# Patient Record
Sex: Male | Born: 1955 | Race: White | Hispanic: No | Marital: Married | Smoking: Never smoker
Health system: Southern US, Community
[De-identification: ages and names within clinical notes are randomized; demographics above are authoritative.]

## PROBLEM LIST (undated history)

## (undated) DIAGNOSIS — I712 Thoracic aortic aneurysm, without rupture, unspecified: Secondary | ICD-10-CM

## (undated) DIAGNOSIS — R519 Headache, unspecified: Secondary | ICD-10-CM

## (undated) DIAGNOSIS — R51 Headache: Secondary | ICD-10-CM

## (undated) DIAGNOSIS — M1990 Unspecified osteoarthritis, unspecified site: Secondary | ICD-10-CM

## (undated) DIAGNOSIS — I10 Essential (primary) hypertension: Secondary | ICD-10-CM

## (undated) DIAGNOSIS — K3580 Unspecified acute appendicitis: Secondary | ICD-10-CM

## (undated) DIAGNOSIS — K760 Fatty (change of) liver, not elsewhere classified: Secondary | ICD-10-CM

## (undated) DIAGNOSIS — J449 Chronic obstructive pulmonary disease, unspecified: Secondary | ICD-10-CM

## (undated) DIAGNOSIS — I4510 Unspecified right bundle-branch block: Secondary | ICD-10-CM

## (undated) DIAGNOSIS — I219 Acute myocardial infarction, unspecified: Secondary | ICD-10-CM

## (undated) DIAGNOSIS — M359 Systemic involvement of connective tissue, unspecified: Secondary | ICD-10-CM

## (undated) DIAGNOSIS — R739 Hyperglycemia, unspecified: Secondary | ICD-10-CM

## (undated) DIAGNOSIS — E668 Other obesity: Secondary | ICD-10-CM

## (undated) DIAGNOSIS — K3532 Acute appendicitis with perforation and localized peritonitis, without abscess: Secondary | ICD-10-CM

## (undated) DIAGNOSIS — G4730 Sleep apnea, unspecified: Secondary | ICD-10-CM

## (undated) DIAGNOSIS — I700 Atherosclerosis of aorta: Secondary | ICD-10-CM

## (undated) DIAGNOSIS — E669 Obesity, unspecified: Secondary | ICD-10-CM

## (undated) DIAGNOSIS — I77810 Thoracic aortic ectasia: Secondary | ICD-10-CM

## (undated) DIAGNOSIS — I499 Cardiac arrhythmia, unspecified: Secondary | ICD-10-CM

## (undated) HISTORY — DX: Thoracic aortic aneurysm, without rupture: I71.2

## (undated) HISTORY — DX: Unspecified acute appendicitis: K35.80

## (undated) HISTORY — DX: Fatty (change of) liver, not elsewhere classified: K76.0

## (undated) HISTORY — DX: Acute appendicitis with perforation and localized peritonitis, without abscess: K35.32

## (undated) HISTORY — DX: Hyperglycemia, unspecified: R73.9

## (undated) HISTORY — DX: Atherosclerosis of aorta: I70.0

## (undated) HISTORY — DX: Other obesity: E66.8

## (undated) HISTORY — DX: Obesity, unspecified: E66.9

## (undated) HISTORY — DX: Thoracic aortic ectasia: I77.810

## (undated) HISTORY — DX: Unspecified right bundle-branch block: I45.10

## (undated) HISTORY — DX: Essential (primary) hypertension: I10

## (undated) SURGERY — APPENDECTOMY, LAPAROSCOPIC
Anesthesia: General

---

## 1993-06-09 HISTORY — PX: BACK SURGERY: SHX140

## 1997-11-10 ENCOUNTER — Ambulatory Visit (HOSPITAL_COMMUNITY): Admission: RE | Admit: 1997-11-10 | Discharge: 1997-11-11 | Payer: Self-pay | Admitting: Neurosurgery

## 2014-06-09 DIAGNOSIS — I219 Acute myocardial infarction, unspecified: Secondary | ICD-10-CM

## 2014-06-09 HISTORY — DX: Acute myocardial infarction, unspecified: I21.9

## 2016-05-09 DIAGNOSIS — I712 Thoracic aortic aneurysm, without rupture, unspecified: Secondary | ICD-10-CM

## 2016-05-09 DIAGNOSIS — I4510 Unspecified right bundle-branch block: Secondary | ICD-10-CM

## 2016-05-09 HISTORY — DX: Unspecified right bundle-branch block: I45.10

## 2016-05-09 HISTORY — DX: Thoracic aortic aneurysm, without rupture, unspecified: I71.20

## 2016-05-09 HISTORY — PX: CARDIAC CATHETERIZATION: SHX172

## 2016-05-09 HISTORY — DX: Thoracic aortic aneurysm, without rupture: I71.2

## 2016-12-12 DIAGNOSIS — Z01818 Encounter for other preprocedural examination: Secondary | ICD-10-CM | POA: Diagnosis not present

## 2016-12-12 DIAGNOSIS — M75100 Unspecified rotator cuff tear or rupture of unspecified shoulder, not specified as traumatic: Secondary | ICD-10-CM | POA: Diagnosis not present

## 2016-12-12 DIAGNOSIS — Z6837 Body mass index (BMI) 37.0-37.9, adult: Secondary | ICD-10-CM | POA: Diagnosis not present

## 2016-12-12 DIAGNOSIS — Z1389 Encounter for screening for other disorder: Secondary | ICD-10-CM | POA: Diagnosis not present

## 2017-01-13 ENCOUNTER — Telehealth: Payer: Self-pay | Admitting: Cardiology

## 2017-01-13 NOTE — Telephone Encounter (Signed)
01/13/17-Received incoming records from [facility] for upcoming appointment on 02/02/17 @ 8:40am with Dr. Ellyn Hack. Records given to [facility] in Medical Records. ab

## 2017-01-20 ENCOUNTER — Encounter: Payer: Self-pay | Admitting: *Deleted

## 2017-02-02 ENCOUNTER — Encounter: Payer: Self-pay | Admitting: Cardiology

## 2017-02-02 ENCOUNTER — Ambulatory Visit (INDEPENDENT_AMBULATORY_CARE_PROVIDER_SITE_OTHER): Payer: BLUE CROSS/BLUE SHIELD | Admitting: Cardiology

## 2017-02-02 VITALS — BP 109/78 | HR 68 | Ht 69.0 in | Wt 252.0 lb

## 2017-02-02 DIAGNOSIS — R0609 Other forms of dyspnea: Secondary | ICD-10-CM | POA: Diagnosis not present

## 2017-02-02 DIAGNOSIS — R0600 Dyspnea, unspecified: Secondary | ICD-10-CM

## 2017-02-02 DIAGNOSIS — I712 Thoracic aortic aneurysm, without rupture, unspecified: Secondary | ICD-10-CM

## 2017-02-02 DIAGNOSIS — Z01818 Encounter for other preprocedural examination: Secondary | ICD-10-CM | POA: Diagnosis not present

## 2017-02-02 DIAGNOSIS — I10 Essential (primary) hypertension: Secondary | ICD-10-CM | POA: Diagnosis not present

## 2017-02-02 DIAGNOSIS — Z01810 Encounter for preprocedural cardiovascular examination: Secondary | ICD-10-CM
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
## 2017-02-02 NOTE — Patient Instructions (Signed)
NO CHANGE IN MEDICATIONS  SCHEDULE AT [place] [facility] Non-Cardiac CT scanning, (CAT scanning), is a noninvasive, special x-ray that produces cross-sectional images of the body using x-rays and a computer- thoracic aortic aneurysm. CT scans help physicians diagnose and treat medical conditions. For some CT exams, a contrast material is used to enhance visibility in the area of the body being studied. CT scans provide greater clarity and reveal more details than regular x-ray exams.   SCHEDULE AT [facility] has requested that you have an echocardiogram. Echocardiography is a painless test that uses sound waves to create images of your heart. It provides your doctor with information about the size and shape of your heart and how well your heart's chambers and valves are working. This procedure takes approximately one hour. There are no restrictions for this procedure.   MAY NEED LABS - BMP FOR CT SCAN     IF ECHO IS NORMAL YOU WILL BE CLEARED FOR SURGERY.    Your physician recommends that you schedule a follow-up appointment in Dec 2018 with DR HARDING.

## 2017-02-04 ENCOUNTER — Encounter: Payer: Self-pay | Admitting: Cardiology

## 2017-02-04 DIAGNOSIS — I10 Essential (primary) hypertension: Secondary | ICD-10-CM | POA: Insufficient documentation

## 2017-02-04 DIAGNOSIS — Z01810 Encounter for preprocedural cardiovascular examination: Secondary | ICD-10-CM | POA: Insufficient documentation

## 2017-02-04 DIAGNOSIS — I712 Thoracic aortic aneurysm, without rupture, unspecified: Secondary | ICD-10-CM | POA: Insufficient documentation

## 2017-02-04 DIAGNOSIS — R0609 Other forms of dyspnea: Secondary | ICD-10-CM | POA: Insufficient documentation

## 2017-02-04 DIAGNOSIS — R0600 Dyspnea, unspecified: Secondary | ICD-10-CM | POA: Insufficient documentation

## 2017-02-04 HISTORY — DX: Essential (primary) hypertension: I10

## 2017-02-04 NOTE — Assessment & Plan Note (Signed)
Richard Gallagher is planning to have right shoulder surgery done which is a low risk surgery from a cardiac standpoint.  he just had a heart catheterization which showed normal coronary arteries and preserve I am checking a echocardiogram because of his dilated aortic root and systolic murmur. I suspect he has some aortic sclerosis, but there is no comment on stenosis by cath. He does not have any heart failure symptoms of PND, orthopnea or edema (however neck of cardiomyopathy understand his diastolic filling pressures. He does not have diabetes documented, has normal renal function, and  Does not history of stroke.  PREOPERATIVE CARDIAC RISK ASSESSMENT   Revised Cardiac Risk Index:  High Risk Surgery: no; Low  Defined as Intraperitoneal, intrathoracic or suprainguinal vascular  Active CAD: no; Normal Cath  CHF: no; Normal EF (Checking Echo to assess Diastolic function & filling pressures)  Cerebrovascular Disease: no;  Diabetes: no; On Insulin: no  CKD (Cr >~ 2): no;  0.86  Total: 0 Estimated Risk of Adverse Outcome: LOW RISK for LOW RISK SURGERY  Estimated Risk of MI, PE, VF/VT (Cardiac Arrest), Complete Heart Block: <1 % - further reduced by long-term BB dose.  No further Cardiac Evaluation is needed for ischemic evaluation given recent Cardiac Catheterizations. Checking 2 D Echo more to assess murmur & Aortic Root diameter.  Recommend: Proceed to OR without further evaluation.  LOW RISK. - monitor for OSA post-op.

## 2017-02-04 NOTE — Assessment & Plan Note (Signed)
This is a long-standing issue for him.  he does not appear to have any active heart failure symptoms with no PND or orthopnea. Technique 2-D echocardiogram to evaluate his murmur andaortic root, however this will help [place] know his diastolic filling pressures.   He is on ARB and atenolol.

## 2017-02-04 NOTE — Assessment & Plan Note (Addendum)
Noted on thoracic aortic angiography  Done in [place] will order a CT angiogram of the chest to establish a baseline evaluation here in [place]. We will also check a 2-D echocardiogram to evaluate for aortic valve disease given his systolic murmur -  The murmur sounds more consistent with aortic sclerosis and not stenosis however.   Otherwise plan will be to continue risk factor modification with blood pressure control and lipid control. His last set of lipids showed LDL of 85 which ispretty much at [facility] for him. He is lowDL 32 for which I recommended exercise.

## 2017-02-04 NOTE — Assessment & Plan Note (Signed)
Well controlled  On current dose of beta blocker and ARB.

## 2017-02-11 ENCOUNTER — Other Ambulatory Visit: Payer: Self-pay

## 2017-02-11 ENCOUNTER — Ambulatory Visit (HOSPITAL_COMMUNITY): Payer: BLUE CROSS/BLUE SHIELD | Attending: Cardiology

## 2017-02-11 DIAGNOSIS — R0600 Dyspnea, unspecified: Secondary | ICD-10-CM

## 2017-02-11 DIAGNOSIS — Z01810 Encounter for preprocedural cardiovascular examination: Secondary | ICD-10-CM | POA: Diagnosis not present

## 2017-02-11 DIAGNOSIS — I712 Thoracic aortic aneurysm, without rupture, unspecified: Secondary | ICD-10-CM

## 2017-02-11 DIAGNOSIS — R0609 Other forms of dyspnea: Secondary | ICD-10-CM | POA: Insufficient documentation

## 2017-03-06 DIAGNOSIS — J208 Acute bronchitis due to other specified organisms: Secondary | ICD-10-CM | POA: Diagnosis not present

## 2017-03-06 DIAGNOSIS — Z6838 Body mass index (BMI) 38.0-38.9, adult: Secondary | ICD-10-CM | POA: Diagnosis not present

## 2017-03-16 DIAGNOSIS — Z6838 Body mass index (BMI) 38.0-38.9, adult: Secondary | ICD-10-CM | POA: Diagnosis not present

## 2017-03-16 DIAGNOSIS — R05 Cough: Secondary | ICD-10-CM | POA: Diagnosis not present

## 2017-03-16 DIAGNOSIS — E669 Obesity, unspecified: Secondary | ICD-10-CM | POA: Diagnosis not present

## 2017-04-09 HISTORY — PX: ROTATOR CUFF REPAIR: SHX139

## 2017-04-20 ENCOUNTER — Inpatient Hospital Stay: Admission: RE | Admit: 2017-04-20 | Payer: BLUE CROSS/BLUE SHIELD | Source: Ambulatory Visit

## 2017-04-23 DIAGNOSIS — G8918 Other acute postprocedural pain: Secondary | ICD-10-CM | POA: Diagnosis not present

## 2017-04-23 DIAGNOSIS — M7542 Impingement syndrome of left shoulder: Secondary | ICD-10-CM | POA: Diagnosis not present

## 2017-04-23 DIAGNOSIS — M75112 Incomplete rotator cuff tear or rupture of left shoulder, not specified as traumatic: Secondary | ICD-10-CM | POA: Diagnosis not present

## 2017-04-23 DIAGNOSIS — M7552 Bursitis of left shoulder: Secondary | ICD-10-CM | POA: Diagnosis not present

## 2017-04-23 DIAGNOSIS — M958 Other specified acquired deformities of musculoskeletal system: Secondary | ICD-10-CM | POA: Diagnosis not present

## 2017-04-23 DIAGNOSIS — M75122 Complete rotator cuff tear or rupture of left shoulder, not specified as traumatic: Secondary | ICD-10-CM | POA: Diagnosis not present

## 2017-04-23 DIAGNOSIS — M24112 Other articular cartilage disorders, left shoulder: Secondary | ICD-10-CM | POA: Diagnosis not present

## 2017-05-08 DIAGNOSIS — M24112 Other articular cartilage disorders, left shoulder: Secondary | ICD-10-CM | POA: Diagnosis not present

## 2017-05-08 DIAGNOSIS — M25532 Pain in left wrist: Secondary | ICD-10-CM | POA: Diagnosis not present

## 2017-05-08 DIAGNOSIS — M75122 Complete rotator cuff tear or rupture of left shoulder, not specified as traumatic: Secondary | ICD-10-CM | POA: Diagnosis not present

## 2017-05-25 ENCOUNTER — Ambulatory Visit: Payer: BLUE CROSS/BLUE SHIELD | Admitting: Cardiology

## 2017-06-05 DIAGNOSIS — M7552 Bursitis of left shoulder: Secondary | ICD-10-CM | POA: Diagnosis not present

## 2017-06-05 DIAGNOSIS — M75122 Complete rotator cuff tear or rupture of left shoulder, not specified as traumatic: Secondary | ICD-10-CM | POA: Diagnosis not present

## 2017-06-18 DIAGNOSIS — Z23 Encounter for immunization: Secondary | ICD-10-CM | POA: Diagnosis not present

## 2017-06-19 DIAGNOSIS — M25612 Stiffness of left shoulder, not elsewhere classified: Secondary | ICD-10-CM | POA: Diagnosis not present

## 2017-06-19 DIAGNOSIS — M6281 Muscle weakness (generalized): Secondary | ICD-10-CM | POA: Diagnosis not present

## 2017-06-19 DIAGNOSIS — M25512 Pain in left shoulder: Secondary | ICD-10-CM | POA: Diagnosis not present

## 2017-06-20 ENCOUNTER — Encounter: Payer: Self-pay | Admitting: Emergency Medicine

## 2017-06-20 ENCOUNTER — Emergency Department: Payer: BLUE CROSS/BLUE SHIELD

## 2017-06-20 ENCOUNTER — Inpatient Hospital Stay
Admission: EM | Admit: 2017-06-20 | Discharge: 2017-06-25 | DRG: 373 | Disposition: A | Discharge status: Home / Self Care | Payer: BLUE CROSS/BLUE SHIELD | Attending: General Surgery | Admitting: General Surgery

## 2017-06-20 ENCOUNTER — Other Ambulatory Visit: Payer: Self-pay

## 2017-06-20 DIAGNOSIS — I10 Essential (primary) hypertension: Secondary | ICD-10-CM | POA: Diagnosis present

## 2017-06-20 DIAGNOSIS — Z6837 Body mass index (BMI) 37.0-37.9, adult: Secondary | ICD-10-CM

## 2017-06-20 DIAGNOSIS — E668 Other obesity: Secondary | ICD-10-CM | POA: Diagnosis present

## 2017-06-20 DIAGNOSIS — K3532 Acute appendicitis with perforation and localized peritonitis, without abscess: Secondary | ICD-10-CM

## 2017-06-20 DIAGNOSIS — R51 Headache: Secondary | ICD-10-CM | POA: Diagnosis not present

## 2017-06-20 DIAGNOSIS — K3580 Unspecified acute appendicitis: Secondary | ICD-10-CM

## 2017-06-20 DIAGNOSIS — Z79899 Other long term (current) drug therapy: Secondary | ICD-10-CM

## 2017-06-20 DIAGNOSIS — I712 Thoracic aortic aneurysm, without rupture: Secondary | ICD-10-CM | POA: Diagnosis not present

## 2017-06-20 DIAGNOSIS — R109 Unspecified abdominal pain: Secondary | ICD-10-CM | POA: Diagnosis not present

## 2017-06-20 HISTORY — DX: Acute appendicitis with perforation, localized peritonitis, and gangrene, without abscess: K35.32

## 2017-06-20 LAB — CBC
HCT: 45.8 % (ref 40.0–52.0)
HEMATOCRIT: 47.5 % (ref 40.0–52.0)
HEMOGLOBIN: 16 g/dL (ref 13.0–18.0)
HEMOGLOBIN: 15.4 g/dL (ref 13.0–18.0)
MCH: 28 pg (ref 26.0–34.0)
MCH: 28.3 pg (ref 26.0–34.0)
MCHC: 33.8 g/dL (ref 32.0–36.0)
MCHC: 33.7 g/dL (ref 32.0–36.0)
MCV: 82.9 fL (ref 80.0–100.0)
MCV: 83.8 fL (ref 80.0–100.0)
Platelets: 302 10*3/uL (ref 150–440)
Platelets: 238 10*3/uL (ref 150–440)
RBC: 5.72 MIL/uL (ref 4.40–5.90)
RBC: 5.46 MIL/uL (ref 4.40–5.90)
RDW: 13.7 % (ref 11.5–14.5)
RDW: 13.9 % (ref 11.5–14.5)
WBC: 22 10*3/uL — ABNORMAL HIGH (ref 3.8–10.6)
WBC: 21.5 10*3/uL — AB (ref 3.8–10.6)

## 2017-06-20 LAB — URINALYSIS, COMPLETE (UACMP) WITH MICROSCOPIC
BILIRUBIN URINE: NEGATIVE
Glucose, UA: NEGATIVE mg/dL
Hgb urine dipstick: NEGATIVE
Ketones, ur: NEGATIVE mg/dL
Leukocytes, UA: NEGATIVE
NITRITE: NEGATIVE
PH: 6 (ref 5.0–8.0)
Protein, ur: NEGATIVE mg/dL
SPECIFIC GRAVITY, URINE: 1.01 (ref 1.005–1.030)
Squamous Epithelial / LPF: NONE SEEN

## 2017-06-20 LAB — CREATININE, SERUM
Creatinine, Ser: 0.78 mg/dL (ref 0.61–1.24)
GFR calc non Af Amer: >60 mL/min (ref >60)

## 2017-06-20 LAB — LIPASE, BLOOD: Lipase: 28 U/L (ref 11–51)

## 2017-06-20 LAB — COMPREHENSIVE METABOLIC PANEL
ALBUMIN: 3.8 g/dL (ref 3.5–5.0)
ALK PHOS: 68 U/L (ref 38–126)
ALT: 28 U/L (ref 17–63)
AST: 30 U/L (ref 15–41)
Anion gap: 14 (ref 5–15)
BILIRUBIN TOTAL: 1.1 mg/dL (ref 0.3–1.2)
BUN: 14 mg/dL (ref 6–20)
CALCIUM: 9.4 mg/dL (ref 8.9–10.3)
CO2: 25 mmol/L (ref 22–32)
Chloride: 97 mmol/L — ABNORMAL LOW (ref 101–111)
Creatinine, Ser: 0.82 mg/dL (ref 0.61–1.24)
GFR calc Af Amer: >60 mL/min (ref >60)
GFR calc non Af Amer: >60 mL/min (ref >60)
GLUCOSE: 151 mg/dL — AB (ref 65–99)
POTASSIUM: 3.5 mmol/L (ref 3.5–5.1)
SODIUM: 136 mmol/L (ref 135–145)
TOTAL PROTEIN: 7.3 g/dL (ref 6.5–8.1)

## 2017-06-20 MED ORDER — PIPERACILLIN-TAZOBACTAM 3.375 G IVPB
3.3750 g | Freq: Three times a day (TID) | INTRAVENOUS | Status: DC
  Administered 2017-06-20 – 2017-06-25 (×14): 3.375 g via INTRAVENOUS
  Filled 2017-06-20 (×14): qty 50

## 2017-06-20 MED ORDER — ONDANSETRON 4 MG PO TBDP: 4.0000 mg | ORAL_TABLET | Freq: Four times a day (QID) | ORAL | Status: DC | PRN

## 2017-06-20 MED ORDER — IOPAMIDOL (ISOVUE-370) INJECTION 76%
100.0000 mL | Freq: Once | INTRAVENOUS | Status: AC | PRN
  Administered 2017-06-20: 100 mL via INTRAVENOUS

## 2017-06-20 MED ORDER — HYDRALAZINE HCL 20 MG/ML IJ SOLN: 10.0000 mg | INTRAMUSCULAR | Status: DC | PRN

## 2017-06-20 MED ORDER — PANTOPRAZOLE SODIUM 40 MG IV SOLR
40.0000 mg | Freq: Every day | INTRAVENOUS | Status: DC
  Administered 2017-06-20 – 2017-06-24 (×5): 40 mg via INTRAVENOUS
  Filled 2017-06-20 (×5): qty 40

## 2017-06-20 MED ORDER — ENOXAPARIN SODIUM 40 MG/0.4ML ~~LOC~~ SOLN
40.0000 mg | SUBCUTANEOUS | Status: DC
  Administered 2017-06-20 – 2017-06-24 (×5): 40 mg via SUBCUTANEOUS
  Filled 2017-06-20 (×5): qty 0.4

## 2017-06-20 MED ORDER — ONDANSETRON HCL 4 MG/2ML IJ SOLN: 4.0000 mg | Freq: Four times a day (QID) | INTRAMUSCULAR | Status: DC | PRN

## 2017-06-20 MED ORDER — ONDANSETRON 4 MG PO TBDP
ORAL_TABLET | ORAL | Status: AC
  Filled 2017-06-20: qty 1

## 2017-06-20 MED ORDER — LACTATED RINGERS IV SOLN
INTRAVENOUS | Status: DC
  Administered 2017-06-20 – 2017-06-22 (×5): via INTRAVENOUS

## 2017-06-20 MED ORDER — ONDANSETRON 4 MG PO TBDP
4.0000 mg | ORAL_TABLET | Freq: Once | ORAL | Status: AC | PRN
  Administered 2017-06-20: 4 mg via ORAL

## 2017-06-20 MED ORDER — ONDANSETRON HCL 4 MG/2ML IJ SOLN
4.0000 mg | Freq: Once | INTRAMUSCULAR | Status: AC
  Administered 2017-06-20: 4 mg via INTRAVENOUS
  Filled 2017-06-20: qty 2

## 2017-06-20 MED ORDER — ACETAMINOPHEN 325 MG PO TABS
650.0000 mg | ORAL_TABLET | Freq: Four times a day (QID) | ORAL | Status: DC | PRN
  Administered 2017-06-20: 650 mg via ORAL
  Filled 2017-06-20: qty 2

## 2017-06-20 MED ORDER — IOPAMIDOL (ISOVUE-300) INJECTION 61%
30.0000 mL | Freq: Once | INTRAVENOUS | Status: AC | PRN
  Administered 2017-06-20: 30 mL via ORAL

## 2017-06-20 MED ORDER — DIPHENHYDRAMINE HCL 12.5 MG/5ML PO ELIX
12.5000 mg | ORAL_SOLUTION | Freq: Four times a day (QID) | ORAL | Status: DC | PRN
  Filled 2017-06-20: qty 5

## 2017-06-20 MED ORDER — PIPERACILLIN-TAZOBACTAM 3.375 G IVPB 30 MIN
3.3750 g | Freq: Once | INTRAVENOUS | Status: AC
  Administered 2017-06-20: 3.375 g via INTRAVENOUS
  Filled 2017-06-20: qty 50

## 2017-06-20 MED ORDER — SODIUM CHLORIDE 0.9 % IV BOLUS (SEPSIS)
1000.0000 mL | Freq: Once | INTRAVENOUS | Status: AC
  Administered 2017-06-20: 1000 mL via INTRAVENOUS

## 2017-06-20 MED ORDER — MORPHINE SULFATE (PF) 4 MG/ML IV SOLN
4.0000 mg | INTRAVENOUS | Status: DC | PRN
  Administered 2017-06-20 – 2017-06-21 (×2): 4 mg via INTRAVENOUS
  Filled 2017-06-20 (×2): qty 1

## 2017-06-20 MED ORDER — DIPHENHYDRAMINE HCL 50 MG/ML IJ SOLN: 12.5000 mg | Freq: Four times a day (QID) | INTRAMUSCULAR | Status: DC | PRN

## 2017-06-20 NOTE — ED Notes (Signed)
Pt in CT.

## 2017-06-20 NOTE — H&P (Signed)
Patient ID: Richard Gallagher, male   DOB: February 21, 1956, 62 y.o.   MRN: [id number]  CC: Abdominal pain  HPI Richard Gallagher is a 62 y.o. male presents the emergency department with a now 3-day history of abdominal pain.  Patient reports that the pain was actually at its maximal intensity last night and then gradually relieved.  He still has tenderness to the right lower quadrant but not near as bad as last night.  He has had subjective chills but denies any fevers, nausea, vomiting, chest pain, shortness of breath, diarrhea, constipation.  He has had a lack of appetite secondary to the pain.  He came to the hospital today because his family made him due to the prolonged symptoms of pain.  He is otherwise in his usual state of health and denies any recent sick contacts or travels.  HPI  Past Medical History:  Diagnosis Date  . Hypertension   . Moderate obesity    Had been better controlled while he was living in [place]. He gained 30 pounds since returning in January 2018  . Right bundle branch block (RBBB) on electrocardiogram (ECG) 05/2016  . Thoracic aortic aneurysm without rupture ([place]) 05/2016   Noted on Cardiac Cath with TAA Angio - Aortic Root 5.1 cm    Past Surgical History:  Procedure Laterality Date  . BACK SURGERY    . CARDIAC CATHETERIZATION  05/2016    ([place], [place]. Dr. Modesto Charon) 05/22/16: EF 55%. Normal coronary arteries. Dilated aortic root at 5.1 cm. 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 codominant  . ROTATOR CUFF REPAIR Left     Family History  Problem Relation Age of Onset  . Hypertension Mother   . Diabetes Mother   . Heart disease Mother        He is not sure of details  . Arthritis Mother   . Hypertension Father   . Diabetes Father   . Arthritis Father   . Stroke Brother     Social History Social History   Tobacco Use  . Smoking status: Never Smoker  . Smokeless tobacco: Never Used  Substance Use Topics  .  Alcohol use: Yes    Comment:  moderate use  . Drug use: No    No Known Allergies  Current Facility-Administered Medications  Medication Dose Route Frequency Provider Last Rate Last Dose  . piperacillin-tazobactam (ZOSYN) IVPB 3.375 g  3.375 g Intravenous Once Lisa Roca, MD 100 mL/hr at 06/20/17 1525 3.375 g at 06/20/17 1525   Current Outpatient Medications  Medication Sig Dispense Refill  . atenolol (TENORMIN) 50 MG tablet Take 50 mg by mouth daily.  0  . hydrochlorothiazide (HYDRODIURIL) 25 MG tablet Take 25 mg by mouth daily.  0  . losartan (COZAAR) 25 MG tablet Take 25 mg by mouth daily.  0  . MULTIPLE VITAMIN PO Take 1 tablet by mouth daily.    . Multiple Vitamins-Minerals (ZINC PO) Take 1 tablet by mouth daily.    . Omega-3 Fatty Acids (FISH OIL) 1000 MG CAPS Take 1,000 mg by mouth daily.    Marland Kitchen zolpidem (AMBIEN) 5 MG tablet Take 5 mg by mouth at bedtime as needed.  1     Review of Systems A thigh point review of systems was asked and was negative except for the findings documented in the HPI  Physical Exam Blood pressure 110/61, pulse 87, temperature 99.5 F (37.5 C), resp. rate 16, height 5' 9"  (1.753  m), weight 113.4 kg (250 lb), SpO2 96 %. CONSTITUTIONAL: No acute distress. EYES: Pupils are equal, round, and reactive to light, Sclera are non-icteric. EARS, NOSE, MOUTH AND THROAT: The oropharynx is clear. The oral mucosa is pink and moist. Hearing is intact to voice. LYMPH NODES:  Lymph nodes in the neck are normal. RESPIRATORY:  Lungs are clear. There is normal respiratory effort, with equal breath sounds bilaterally, and without pathologic use of accessory muscles. CARDIOVASCULAR: Heart is regular without murmurs, gallops, or rubs. GI: The abdomen is large, soft, mildly tender to palpation in the right lower quadrant but with a negative Rovsing or rebound, and nondistended. There are no palpable masses. There is no hepatosplenomegaly. There are normal bowel sounds in  all quadrants. GU: Rectal deferred.   MUSCULOSKELETAL: Normal muscle strength and tone. No cyanosis or edema.   SKIN: Turgor is good and there are no pathologic skin lesions or ulcers. NEUROLOGIC: Motor and sensation is grossly normal. Cranial nerves are grossly intact. PSYCH:  Oriented to person, place and time. Affect is normal.  Data Reviewed Images and labs reviewed which show a leukocytosis of 22 but are otherwise within normal limits.  CT scan of the abdomen shows a dilated thickened appendix with evidence of mid body rupture.  There is inflammation involving the entire length of the appendix, the cecum, the distal small bowel. I have personally reviewed the patient's imaging, laboratory findings and medical records.    Assessment    Ruptured appendicitis    Plan    62 year old male with a ruptured appendicitis with inflammation involving the cecum.  Discussed with the patient and the family that given his state of being ruptured it is actually higher risk to go to surgery then to be treated with antibiotics.  Discussed the plan would be for admission for IV hydration and IV antibiotics.  Counseled that should he worsen in spite of IV antibiotics then we would proceed with surgery and accept the increased risk of having to remove more than just his appendix to get out the infection.  They voiced understanding.  Plan for admission, IV fluid resuscitation, IV antibiotics.     Time spent with the patient was 50 minutes, with more than 50% of the time spent in face-to-face education, counseling and care coordination.     Clayburn Pert, MD FACS General Surgeon 06/20/2017, 3:43 PM

## 2017-06-20 NOTE — ED Provider Notes (Signed)
[facility] Emergency Department Provider Note ____________________________________________   I have reviewed the triage vital signs and the triage nursing note.  HISTORY  Chief Complaint Abdominal Pain   Historian Patient, family members at the bedside  HPI Richard Gallagher is a 62 y.o. male presenting for 24 hours of abdominal pain also in the right lower quadrant.  Overnight was severe about 10 out of 10 with belly/abdominal swelling.  Pain did seem to decrease this morning and currently is about 4 5 out of 10 as long as he is not moving.  Any movement will make it significantly worse.  This morning states he had some burning with urination.  Positive for nausea without vomiting or diarrhea.  States he had "appendicitis" when he was a child however his symptoms went away so he did not have surgery.  He states he was thinking he could have possible appendicitis based on the way that it feels.     Past Medical History:  Diagnosis Date  . Hypertension   . Moderate obesity    Had been better controlled while he was living in [place]. He gained 30 pounds since returning in January 2018  . Right bundle branch block (RBBB) on electrocardiogram (ECG) 05/2016  . Thoracic aortic aneurysm without rupture ([place]) 05/2016   Noted on Cardiac Cath with TAA Angio - Aortic Root 5.1 cm    Patient Active Problem List   Diagnosis Date Noted  . Pre-operative cardiovascular examination 02/04/2017  . DOE (dyspnea on exertion) 02/04/2017  . Thoracic aortic aneurysm without rupture ([place]) 02/04/2017  . Essential hypertension 02/04/2017    Past Surgical History:  Procedure Laterality Date  . BACK SURGERY    . CARDIAC CATHETERIZATION  05/2016    ([place], [place]. Dr. Modesto Charon) 05/22/16: EF 55%. Normal coronary arteries. Dilated aortic root at 5.1 cm. 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 codominant  . ROTATOR CUFF REPAIR Left      Prior to Admission medications   Medication Sig Start Date End Date Taking? Authorizing Provider  atenolol (TENORMIN) 50 MG tablet Take 50 mg by mouth daily. 01/15/17   [provider]  hydrochlorothiazide (HYDRODIURIL) 25 MG tablet Take 25 mg by mouth daily. 01/15/17   [provider]  losartan (COZAAR) 25 MG tablet Take 25 mg by mouth daily. 01/15/17   [provider]  MULTIPLE VITAMIN PO Take 1 tablet by mouth daily.    [provider]  Multiple Vitamins-Minerals (ZINC PO) Take 1 tablet by mouth daily.    [provider]  Omega-3 Fatty Acids (FISH OIL) 1000 MG CAPS Take 1,000 mg by mouth daily.    [provider]  zolpidem (AMBIEN) 5 MG tablet Take 5 mg by mouth at bedtime as needed. 01/15/17   [provider]    No Known Allergies  Family History  Problem Relation Age of Onset  . Hypertension Mother   . Diabetes Mother   . Heart disease Mother        He is not sure of details  . Arthritis Mother   . Hypertension Father   . Diabetes Father   . Arthritis Father   . Stroke Brother     Social History Social History   Tobacco Use  . Smoking status: Never Smoker  . Smokeless tobacco: Never Used  Substance Use Topics  . Alcohol use: Yes    Comment:  moderate use  . Drug use: No  Review of Systems  Constitutional: Negative for fever. Eyes: Negative for visual changes. ENT: Negative for sore throat. Cardiovascular: Negative for chest pain. Respiratory: Negative for shortness of breath. Gastrointestinal: Positive for abdominal pain especially in the right lower quadrant as per HPI Genitourinary: Negative for hematuria. Musculoskeletal: Negative for back pain. Skin: Negative for rash. Neurological: Negative for headache.  ____________________________________________   PHYSICAL EXAM:  VITAL SIGNS: ED Triage Vitals  Enc Vitals Group     BP 06/20/17 1055 138/83     Pulse Rate 06/20/17 1055 (!) 106      Resp 06/20/17 1055 20     Temp 06/20/17 1055 99.5 F (37.5 C)     Temp src --      SpO2 06/20/17 1055 93 %     Weight 06/20/17 1058 250 lb (113.4 kg)     Height 06/20/17 1058 5\' 9"  (1.753 m)     Head Circumference --      Peak Flow --      Pain Score 06/20/17 1057 6     Pain Loc --      Pain Edu? --      Excl. in [place]? --      Constitutional: Alert and oriented. Well appearing and in no distress. HEENT   Head: Normocephalic and atraumatic.      Eyes: Conjunctivae are normal. Pupils equal and round.       Ears:         Nose: No congestion/rhinnorhea.   Mouth/Throat: Mucous membranes are moist.   Neck: No stridor. Cardiovascular/Chest: Normal rate, regular rhythm.  No murmurs, rubs, or gallops. Respiratory: Normal respiratory effort without tachypnea nor retractions. Breath sounds are clear and equal bilaterally. No wheezes/rales/rhonchi. Gastrointestinal: Soft. No distention.  Moderate to severe tenderness with mild guarding in the right lower quadrant.  Mild tenderness diffusely. Genitourinary/rectal:Deferred Musculoskeletal: Nontender with normal range of motion in all extremities. No joint effusions.  No lower extremity tenderness.  No edema. Neurologic:  Normal speech and language. No gross or focal neurologic deficits are appreciated. Skin:  Skin is warm, dry and intact. No rash noted. Psychiatric: Mood and affect are normal. Speech and behavior are normal. Patient exhibits appropriate insight and judgment.   ____________________________________________  LABS (pertinent positives/negatives) I, Lisa Roca, MD the attending physician have reviewed the labs noted below.  Labs Reviewed  COMPREHENSIVE METABOLIC PANEL - Abnormal; Notable for the following components:      Result Value   Chloride 97 (*)    Glucose, Bld 151 (*)    All other components within normal limits  CBC - Abnormal; Notable for the following components:   WBC 22.0 (*)    All other components  within normal limits  URINALYSIS, COMPLETE (UACMP) WITH MICROSCOPIC - Abnormal; Notable for the following components:   Color, Urine STRAW (*)    APPearance CLEAR (*)    Bacteria, UA RARE (*)    All other components within normal limits  LIPASE, BLOOD    ____________________________________________    EKG I, Lisa Roca, MD, the attending physician have personally viewed and interpreted all ECGs.  None ____________________________________________  RADIOLOGY All Xrays were viewed by me.  Imaging interpreted by Radiologist, and I, Lisa Roca, MD the attending physician have reviewed the radiologist interpretation noted below.  CT abdomen pelvis with contrast:  IMPRESSION: Acute appendicitis with severe inflammation and probable focal rupture based on severe inflammation, periappendiceal fluid and single focus of extraluminal gas. No discrete appendiceal abscess identified. There is a  calcified appendicolith at the base of the appendix near the appendiceal orifice. __________________________________________  PROCEDURES  Procedure(s) performed: None  Critical Care performed: None   ____________________________________________  ED COURSE / ASSESSMENT AND PLAN  Pertinent labs & imaging results that were available during my care of the patient were reviewed by me and considered in my medical decision making (see chart for details).  High concern for significant intra-abdominal process with pain and some guarding in the right lower quadrant also associated with elevated white blood cell count.  Discussed CT scan risks and benefits and chose to proceed.  CT scan result shows acute appendicitis with possibility of focal rupture.  Patient given dose of IV Zosyn.  Consult with general surgeon, who will review the CT scan as well as the patient and discuss further management.  Patient and family updated.  DIFFERENTIAL DIAGNOSIS: Differential diagnosis includes, but is not  limited to, ovarian cyst, ovarian torsion, acute appendicitis, diverticulitis, urinary tract infection/pyelonephritis, endometriosis, bowel obstruction, colitis, renal colic, gastroenteritis, hernia, fibroids, endometriosis, pregnancy related pain including ectopic pregnancy, etc.  CONSULTATIONS:   General surgery, Dr. Adonis Huguenin, will evaluate patient.   Patient / Family / Caregiver informed of clinical course, medical decision-making process, and agree with plan.     ___________________________________________   FINAL CLINICAL IMPRESSION(S) / ED DIAGNOSES   Final diagnoses:  Acute appendicitis, unspecified acute appendicitis type      ___________________________________________        Note: This dictation was prepared with Dragon dictation. Any transcriptional errors that result from this process are unintentional    Lisa Roca, MD 06/20/17 1501

## 2017-06-20 NOTE — ED Notes (Signed)
2nd urine sample sent down by this RN.

## 2017-06-20 NOTE — ED Notes (Signed)
Pt states yesterday began with RLQ abd pain. States some nausea but denies vomiting or diarrhea. Still has all belly organs. Alert, oriented. Family at bedside. No distress noted at this time.

## 2017-06-20 NOTE — ED Triage Notes (Signed)
Patient to ER for c/o RLQ abd pain and right groin pain. Patient also reports history of fever this am of 100.7. Patient states pain began last night. +Nausea, but denies any vomiting or diarrhea. Patient states this is worst pain he has ever had last night. +Bloating. Patient had appendicitis as a child, but did not have appendectomy. Denies any h/o kidney stones.

## 2017-06-20 NOTE — ED Notes (Signed)
Pt returned from CT °

## 2017-06-20 NOTE — ED Notes (Signed)
Pt oxygen dropped to 86%. With deep breathing pt oxygen came up to 88%. Family states pt has COPD and possibly sleep apnea. States last time he was in hospital he had to wear oxygen. Placed on 1 L nasal cannula and came up to 92%. Will continue to monitor.

## 2017-06-21 LAB — BASIC METABOLIC PANEL
Anion gap: 9 (ref 5–15)
BUN: 10 mg/dL (ref 6–20)
CO2: 30 mmol/L (ref 22–32)
CREATININE: 0.82 mg/dL (ref 0.61–1.24)
Calcium: 8.4 mg/dL — ABNORMAL LOW (ref 8.9–10.3)
Chloride: 96 mmol/L — ABNORMAL LOW (ref 101–111)
GFR calc Af Amer: >60 mL/min (ref >60)
Glucose, Bld: 134 mg/dL — ABNORMAL HIGH (ref 65–99)
Potassium: 3.1 mmol/L — ABNORMAL LOW (ref 3.5–5.1)
SODIUM: 135 mmol/L (ref 135–145)

## 2017-06-21 LAB — CBC
HCT: 43.8 % (ref 40.0–52.0)
Hemoglobin: 14.5 g/dL (ref 13.0–18.0)
MCH: 28.2 pg (ref 26.0–34.0)
MCHC: 33.1 g/dL (ref 32.0–36.0)
MCV: 85.4 fL (ref 80.0–100.0)
PLATELETS: 210 10*3/uL (ref 150–440)
RBC: 5.12 MIL/uL (ref 4.40–5.90)
RDW: 13.7 % (ref 11.5–14.5)
WBC: 20.4 10*3/uL — ABNORMAL HIGH (ref 3.8–10.6)

## 2017-06-21 MED ORDER — POTASSIUM CHLORIDE 10 MEQ/100ML IV SOLN
10.0000 meq | INTRAVENOUS | Status: AC
  Administered 2017-06-21 (×3): 10 meq via INTRAVENOUS
  Filled 2017-06-21 (×3): qty 100

## 2017-06-21 MED ORDER — BUTALBITAL-APAP-CAFFEINE 50-325-40 MG PO TABS
2.0000 | ORAL_TABLET | Freq: Four times a day (QID) | ORAL | Status: DC | PRN
  Administered 2017-06-21 (×2): 2 via ORAL
  Filled 2017-06-21 (×3): qty 2

## 2017-06-21 MED ORDER — KETOROLAC TROMETHAMINE 15 MG/ML IJ SOLN
15.0000 mg | Freq: Four times a day (QID) | INTRAMUSCULAR | Status: DC | PRN
  Administered 2017-06-21: 15 mg via INTRAVENOUS
  Filled 2017-06-21 (×2): qty 1

## 2017-06-21 NOTE — Progress Notes (Signed)
CC: Ruptured appendicitis Subjective: Patient reports his abdominal pain has improved mildly.  He is passing flatus and having bowel function.  His primary complaint this morning is of a headache.  Objective: Vital signs in last 24 hours: Temp:  [97.7 F (36.5 C)-99.5 F (37.5 C)] 98.1 F (36.7 C) (01/13 0557) Pulse Rate:  [84-106] 92 (01/13 0557) Resp:  [16-28] 22 (01/13 0557) BP: (110-138)/(51-83) 117/67 (01/13 0557) SpO2:  [85 %-97 %] 94 % (01/13 0557) Weight:  [113.4 kg (250 lb)-116.7 kg (257 lb 4.8 oz)] 116.7 kg (257 lb 4.8 oz) (01/12 1658) Last BM Date: 06/20/17  Intake/Output from previous day: 01/12 0701 - 01/13 0700 In: 1847 [P.O.:60; I.V.:755; IV Piggyback:1032] Out: 400 [Urine:400] Intake/Output this shift: Total I/O In: 620 [I.V.:587; IV Piggyback:33] Out: 0   Physical exam:  General: No acute distress Chest: Clear to auscultation  heart: Regular rhythm Abdomen: Large, soft, moderately tender to palpation in the right lower quadrant but without rebound or guarding.  Lab Results: CBC  Recent Labs    06/20/17 1801 06/21/17 0635  WBC 21.5* 20.4*  HGB 15.4 14.5  HCT 45.8 43.8  PLT 238 210   BMET Recent Labs    06/20/17 1056 06/20/17 1801 06/21/17 0635  NA 136  --  135  K 3.5  --  3.1*  CL 97*  --  96*  CO2 25  --  30  GLUCOSE 151*  --  134*  BUN 14  --  10  CREATININE 0.82 0.78 0.82  CALCIUM 9.4  --  8.4*   PT/INR No results for input(s): LABPROT, INR in the last 72 hours. ABG No results for input(s): PHART, HCO3 in the last 72 hours.  Invalid input(s): PCO2, PO2  Studies/Results: Ct Abdomen Pelvis W Contrast  Result Date: 06/20/2017 CLINICAL DATA:  Right lower quadrant abdominal pain and nausea. EXAM: CT ABDOMEN AND PELVIS WITH CONTRAST TECHNIQUE: Multidetector CT imaging of the abdomen and pelvis was performed using the standard protocol following bolus administration of intravenous contrast. CONTRAST:  150m ISOVUE-370 IOPAMIDOL  (ISOVUE-370) INJECTION 76% COMPARISON:  None. FINDINGS: Lower chest: Bibasilar scarring/atelectasis present. Hepatobiliary: No focal liver abnormality is seen. No gallstones, gallbladder wall thickening, or biliary dilatation. Pancreas: Unremarkable. No pancreatic ductal dilatation or surrounding inflammatory changes. Spleen: Normal in size without focal abnormality. Adrenals/Urinary Tract: Adrenal glands are unremarkable. Kidneys are normal, without renal calculi, focal lesion, or hydronephrosis. Bladder is unremarkable. Stomach/Bowel: There is evidence of acute appendicitis. Appendix: Location: Inferior to cecum. Diameter: 22 mm Appendicolith: 15 mm calcified appendicolith present in the proximal appendix near the appendiceal orifice. Mucosal hyper-enhancement: Present. Extraluminal gas: Single focus of extraluminal gas is suspected anterior to the base of the appendix and adjacent to the terminal ileum. Periappendiceal collection: Significant periappendiceal inflammation with a small amount of adjacent free fluid is suggestive of focal ruptured appendicitis. No discrete appendiceal abscess is identified. There is secondary inflammation and thickening of the terminal ileum without obstruction. The stomach is moderately distended with liquid. Vascular/Lymphatic: No significant vascular findings are present. No enlarged abdominal or pelvic lymph nodes. Reproductive: Prostate is unremarkable. Other: No abdominal wall hernia or abnormality. Musculoskeletal: No acute or significant osseous findings. IMPRESSION: Acute appendicitis with severe inflammation and probable focal rupture based on severe inflammation, periappendiceal fluid and single focus of extraluminal gas. No discrete appendiceal abscess identified. There is a calcified appendicolith at the base of the appendix near the appendiceal orifice. Electronically Signed   By: GAletta EdouardM.D.   On:  06/20/2017 14:28    Anti-infectives: Anti-infectives (From  admission, onward)   Start     Dose/Rate Route Frequency Ordered Stop   06/20/17 2200  piperacillin-tazobactam (ZOSYN) IVPB 3.375 g     3.375 g 12.5 mL/hr over 240 Minutes Intravenous Every 8 hours 06/20/17 1722     06/20/17 1500  piperacillin-tazobactam (ZOSYN) IVPB 3.375 g     3.375 g 100 mL/hr over 30 Minutes Intravenous  Once 06/20/17 1456 06/20/17 1555      Assessment/Plan:  62 year old male admitted with a ruptured appendicitis with inflammation involving the cecal base.  Being treated with antibiotics currently.  Minimal improvement of his white blood cell count on antibiotics but his clinical exam has improved.  Okay for clear liquids, provide oral medications for his headache.  Encourage ambulation and incentive spirometer usage.  Follow serial labs and serial exams.  Should he worsen in any way while on antibiotics.  He will be offered an urgent operation for his ruptured appendicitis.  Pelham Hennick T. Adonis Huguenin, MD, [facility] General Surgeon [facility]  [facility] [phone] [facility] [phone] 06/21/2017

## 2017-06-21 NOTE — Progress Notes (Signed)
Patient had complaints of IV burning even with potassium runing at 39ml/hr. Per MD okay to stop as patient received 2.5 bags.

## 2017-06-22 DIAGNOSIS — K3580 Unspecified acute appendicitis: Secondary | ICD-10-CM

## 2017-06-22 LAB — BASIC METABOLIC PANEL
ANION GAP: 8 (ref 5–15)
BUN: 10 mg/dL (ref 6–20)
CHLORIDE: 97 mmol/L — AB (ref 101–111)
CO2: 30 mmol/L (ref 22–32)
CREATININE: 0.73 mg/dL (ref 0.61–1.24)
Calcium: 7.8 mg/dL — ABNORMAL LOW (ref 8.9–10.3)
GFR calc non Af Amer: >60 mL/min (ref >60)
Glucose, Bld: 118 mg/dL — ABNORMAL HIGH (ref 65–99)
Potassium: 3.1 mmol/L — ABNORMAL LOW (ref 3.5–5.1)
SODIUM: 135 mmol/L (ref 135–145)

## 2017-06-22 LAB — HIV ANTIBODY (ROUTINE TESTING W REFLEX): HIV SCREEN 4TH GENERATION: NONREACTIVE

## 2017-06-22 LAB — CBC
HEMATOCRIT: 41.6 % (ref 40.0–52.0)
Hemoglobin: 14 g/dL (ref 13.0–18.0)
MCH: 28.5 pg (ref 26.0–34.0)
MCHC: 33.6 g/dL (ref 32.0–36.0)
MCV: 84.7 fL (ref 80.0–100.0)
Platelets: 186 10*3/uL (ref 150–440)
RBC: 4.91 MIL/uL (ref 4.40–5.90)
RDW: 13.8 % (ref 11.5–14.5)
WBC: 15.1 10*3/uL — ABNORMAL HIGH (ref 3.8–10.6)

## 2017-06-22 MED ORDER — POTASSIUM CHLORIDE CRYS ER 20 MEQ PO TBCR
40.0000 meq | EXTENDED_RELEASE_TABLET | Freq: Once | ORAL | Status: AC
  Administered 2017-06-22: 40 meq via ORAL
  Filled 2017-06-22: qty 2

## 2017-06-22 MED ORDER — HYDROCODONE-ACETAMINOPHEN 5-325 MG PO TABS
1.0000 | ORAL_TABLET | ORAL | Status: DC | PRN
  Administered 2017-06-22 – 2017-06-25 (×8): 1 via ORAL
  Filled 2017-06-22 (×8): qty 1

## 2017-06-22 NOTE — Progress Notes (Signed)
CC: Ruptured appendicitis Subjective: Patient feels much better today.  No nausea vomiting no fevers or chills less pain.  States is improving overall.  Objective: Vital signs in last 24 hours: Temp:  [98.3 F (36.8 C)-98.6 F (37 C)] 98.3 F (36.8 C) (01/14 0458) Pulse Rate:  [87-91] 91 (01/14 0458) Resp:  [20] 20 (01/14 0458) BP: (116-128)/(62-70) 128/70 (01/14 0458) SpO2:  [95 %] 95 % (01/14 0458) Last BM Date: 06/21/17  Intake/Output from previous day: 01/13 0701 - 01/14 0700 In: 3244 [P.O.:840; I.V.:2055; IV Piggyback:349] Out: 1194 [RDEYC:1448] Intake/Output this shift: No intake/output data recorded.  Physical exam:  Vital signs reviewed and stable afebrile abdomen is distended slightly but soft and nontender and minimally if tympanitic at all.  Calves are nontender moderate edema.  No icterus no jaundice  Lab Results: CBC  Recent Labs    06/21/17 0635 06/22/17 0302  WBC 20.4* 15.1*  HGB 14.5 14.0  HCT 43.8 41.6  PLT 210 186   BMET Recent Labs    06/21/17 0635 06/22/17 0302  NA 135 135  K 3.1* 3.1*  CL 96* 97*  CO2 30 30  GLUCOSE 134* 118*  BUN 10 10  CREATININE 0.82 0.73  CALCIUM 8.4* 7.8*   PT/INR No results for input(s): LABPROT, INR in the last 72 hours. ABG No results for input(s): PHART, HCO3 in the last 72 hours.  Invalid input(s): PCO2, PO2  Studies/Results: Ct Abdomen Pelvis W Contrast  Result Date: 06/20/2017 CLINICAL DATA:  Right lower quadrant abdominal pain and nausea. EXAM: CT ABDOMEN AND PELVIS WITH CONTRAST TECHNIQUE: Multidetector CT imaging of the abdomen and pelvis was performed using the standard protocol following bolus administration of intravenous contrast. CONTRAST:  166m ISOVUE-370 IOPAMIDOL (ISOVUE-370) INJECTION 76% COMPARISON:  None. FINDINGS: Lower chest: Bibasilar scarring/atelectasis present. Hepatobiliary: No focal liver abnormality is seen. No gallstones, gallbladder wall thickening, or biliary dilatation.  Pancreas: Unremarkable. No pancreatic ductal dilatation or surrounding inflammatory changes. Spleen: Normal in size without focal abnormality. Adrenals/Urinary Tract: Adrenal glands are unremarkable. Kidneys are normal, without renal calculi, focal lesion, or hydronephrosis. Bladder is unremarkable. Stomach/Bowel: There is evidence of acute appendicitis. Appendix: Location: Inferior to cecum. Diameter: 22 mm Appendicolith: 15 mm calcified appendicolith present in the proximal appendix near the appendiceal orifice. Mucosal hyper-enhancement: Present. Extraluminal gas: Single focus of extraluminal gas is suspected anterior to the base of the appendix and adjacent to the terminal ileum. Periappendiceal collection: Significant periappendiceal inflammation with a small amount of adjacent free fluid is suggestive of focal ruptured appendicitis. No discrete appendiceal abscess is identified. There is secondary inflammation and thickening of the terminal ileum without obstruction. The stomach is moderately distended with liquid. Vascular/Lymphatic: No significant vascular findings are present. No enlarged abdominal or pelvic lymph nodes. Reproductive: Prostate is unremarkable. Other: No abdominal wall hernia or abnormality. Musculoskeletal: No acute or significant osseous findings. IMPRESSION: Acute appendicitis with severe inflammation and probable focal rupture based on severe inflammation, periappendiceal fluid and single focus of extraluminal gas. No discrete appendiceal abscess identified. There is a calcified appendicolith at the base of the appendix near the appendiceal orifice. Electronically Signed   By: GAletta EdouardM.D.   On: 06/20/2017 14:28    Anti-infectives: Anti-infectives (From admission, onward)   Start     Dose/Rate Route Frequency Ordered Stop   06/20/17 2200  piperacillin-tazobactam (ZOSYN) IVPB 3.375 g     3.375 g 12.5 mL/hr over 240 Minutes Intravenous Every 8 hours 06/20/17 1722      06/20/17  1500  piperacillin-tazobactam (ZOSYN) IVPB 3.375 g     3.375 g 100 mL/hr over 30 Minutes Intravenous  Once 06/20/17 1456 06/20/17 1555      Assessment/Plan:  White blood cell count is improved.  Calcium remains slightly depressed.  Will advance diet.  Continue IV antibiotics for now.  Discussed with the patient and his wife these findings and the rationale for offering continued IV antibiotics.  Possibly home in the next day or 2.  Florene Glen, MD, FACS  06/22/2017

## 2017-06-22 NOTE — Progress Notes (Signed)
Talked to Dr. Burt Knack about patient complaints of his abdomen being more distended. Patient receiving 125 ml of LR, order to discontinue IV fluids. RN will continue to monitor.

## 2017-06-23 MED ORDER — ALUM & MAG HYDROXIDE-SIMETH 200-200-20 MG/5ML PO SUSP
30.0000 mL | Freq: Four times a day (QID) | ORAL | Status: DC | PRN
  Administered 2017-06-23: 30 mL via ORAL
  Filled 2017-06-23: qty 30

## 2017-06-23 NOTE — Progress Notes (Signed)
CC: Appendicitis Subjective: This patient be treated for appendicitis with large phlegmon with IV antibiotics.  He feels better today is tolerating a full liquid diet but does not want to advance his diet.  He has no nausea or vomiting.  Denies fevers or chills.  Objective: Vital signs in last 24 hours: Temp:  [97.7 F (36.5 C)-99.7 F (37.6 C)] 97.7 F (36.5 C) (01/15 0515) Pulse Rate:  [87-93] 93 (01/15 0515) Resp:  [17-18] 17 (01/15 0515) BP: (117-136)/(67-80) 136/72 (01/15 0515) SpO2:  [93 %-97 %] 95 % (01/15 0515) Last BM Date: 06/22/17  Intake/Output from previous day: 01/14 0701 - 01/15 0700 In: 3353.2 [P.O.:960; I.V.:2293.2; IV Piggyback:100] Out: 900 [Urine:900] Intake/Output this shift: No intake/output data recorded.  Physical exam:  Vital signs are stable low-grade fever of 99.7. Awake alert and oriented No icterus no jaundice Abdomen is soft minimally tender without peritoneal signs.  Calves are nontender minimal edema. No icterus no jaundice  Lab Results: CBC  Recent Labs    06/21/17 0635 06/22/17 0302  WBC 20.4* 15.1*  HGB 14.5 14.0  HCT 43.8 41.6  PLT 210 186   BMET Recent Labs    06/21/17 0635 06/22/17 0302  NA 135 135  K 3.1* 3.1*  CL 96* 97*  CO2 30 30  GLUCOSE 134* 118*  BUN 10 10  CREATININE 0.82 0.73  CALCIUM 8.4* 7.8*   PT/INR No results for input(s): LABPROT, INR in the last 72 hours. ABG No results for input(s): PHART, HCO3 in the last 72 hours.  Invalid input(s): PCO2, PO2  Studies/Results: No results found.  Anti-infectives: Anti-infectives (From admission, onward)   Start     Dose/Rate Route Frequency Ordered Stop   06/20/17 2200  piperacillin-tazobactam (ZOSYN) IVPB 3.375 g     3.375 g 12.5 mL/hr over 240 Minutes Intravenous Every 8 hours 06/20/17 1722     06/20/17 1500  piperacillin-tazobactam (ZOSYN) IVPB 3.375 g     3.375 g 100 mL/hr over 30 Minutes Intravenous  Once 06/20/17 1456 06/20/17 1555       Assessment/Plan:  Patient doing well today.  Continue IV antibiotics.  Will check white blood cell count tomorrow and if afebrile with a normalized white blood cell count then possibly discharge on oral antibiotics tomorrow.  Florene Glen, MD, FACS  06/23/2017

## 2017-06-24 ENCOUNTER — Inpatient Hospital Stay: Payer: BLUE CROSS/BLUE SHIELD

## 2017-06-24 LAB — CBC WITH DIFFERENTIAL/PLATELET
Basophils Absolute: 0 10*3/uL (ref 0–0.1)
Basophils Relative: 0 %
Eosinophils Absolute: 0.5 10*3/uL (ref 0–0.7)
Eosinophils Relative: 3 %
HCT: 41.6 % (ref 40.0–52.0)
HEMOGLOBIN: 13.7 g/dL (ref 13.0–18.0)
Lymphocytes Relative: 6 %
Lymphs Abs: 0.9 10*3/uL — ABNORMAL LOW (ref 1.0–3.6)
MCH: 28.2 pg (ref 26.0–34.0)
MCHC: 33.1 g/dL (ref 32.0–36.0)
MCV: 85.4 fL (ref 80.0–100.0)
MONOS PCT: 11 %
Monocytes Absolute: 1.7 10*3/uL — ABNORMAL HIGH (ref 0.2–1.0)
NEUTROS PCT: 80 %
Neutro Abs: 11.9 10*3/uL — ABNORMAL HIGH (ref 1.4–6.5)
Platelets: 259 10*3/uL (ref 150–440)
RBC: 4.87 MIL/uL (ref 4.40–5.90)
RDW: 14 % (ref 11.5–14.5)
WBC: 15.1 10*3/uL — ABNORMAL HIGH (ref 3.8–10.6)

## 2017-06-24 LAB — BASIC METABOLIC PANEL
Anion gap: 10 (ref 5–15)
BUN: 8 mg/dL (ref 6–20)
CALCIUM: 8.2 mg/dL — AB (ref 8.9–10.3)
CHLORIDE: 98 mmol/L — AB (ref 101–111)
CO2: 29 mmol/L (ref 22–32)
CREATININE: 0.78 mg/dL (ref 0.61–1.24)
GFR calc non Af Amer: >60 mL/min (ref >60)
Glucose, Bld: 123 mg/dL — ABNORMAL HIGH (ref 65–99)
Potassium: 3.7 mmol/L (ref 3.5–5.1)
SODIUM: 137 mmol/L (ref 135–145)

## 2017-06-24 MED ORDER — IOPAMIDOL (ISOVUE-370) INJECTION 76%
85.0000 mL | Freq: Once | INTRAVENOUS | Status: AC | PRN
  Administered 2017-06-24: 85 mL via INTRAVENOUS

## 2017-06-24 MED ORDER — IOPAMIDOL (ISOVUE-300) INJECTION 61%
15.0000 mL | INTRAVENOUS | Status: AC
  Administered 2017-06-24 (×2): 15 mL via ORAL

## 2017-06-24 NOTE — Progress Notes (Signed)
CC: Right lower quadrant cramping Subjective: Patient feels better but is noticing some right lower quadrant cramping pain.  No nausea or vomiting.  No fevers or chills.  Objective: Vital signs in last 24 hours: Temp:  [98.1 F (36.7 C)-98.6 F (37 C)] 98.3 F (36.8 C) (01/16 0800) Pulse Rate:  [86-89] 88 (01/16 0426) Resp:  [18] 18 (01/15 2007) BP: (137-150)/(67-83) 150/83 (01/16 0426) SpO2:  [91 %-98 %] 94 % (01/16 0856) Last BM Date: 06/23/17  Intake/Output from previous day: 01/15 0701 - 01/16 0700 In: 724 [P.O.:240; IV Piggyback:484] Out: 1550 [UUVOZ:3664] Intake/Output this shift: No intake/output data recorded.  Physical exam:  Vital signs reviewed and stable no further fevers.  Awake alert and oriented.  Abdomen is soft minimally tender in the right lower quadrant with no guarding or rebound.  Nontender calves minimal edema.  Jaundice no icterus  Lab Results: CBC  Recent Labs    06/22/17 0302 06/24/17 0442  WBC 15.1* 15.1*  HGB 14.0 13.7  HCT 41.6 41.6  PLT 186 259   BMET Recent Labs    06/22/17 0302 06/24/17 0442  NA 135 137  K 3.1* 3.7  CL 97* 98*  CO2 30 29  GLUCOSE 118* 123*  BUN 10 8  CREATININE 0.73 0.78  CALCIUM 7.8* 8.2*   PT/INR No results for input(s): LABPROT, INR in the last 72 hours. ABG No results for input(s): PHART, HCO3 in the last 72 hours.  Invalid input(s): PCO2, PO2  Studies/Results: No results found.  Anti-infectives: Anti-infectives (From admission, onward)   Start     Dose/Rate Route Frequency Ordered Stop   06/20/17 2200  piperacillin-tazobactam (ZOSYN) IVPB 3.375 g     3.375 g 12.5 mL/hr over 240 Minutes Intravenous Every 8 hours 06/20/17 1722     06/20/17 1500  piperacillin-tazobactam (ZOSYN) IVPB 3.375 g     3.375 g 100 mL/hr over 30 Minutes Intravenous  Once 06/20/17 1456 06/20/17 1555      Assessment/Plan:  No further fevers but white blood cell count remains elevated at 15.1.  Rather than changing to  oral antibiotics today I would like to obtain more information in the form of a CT scan to see if he has any sign of abscess formation that would be drainable at this time if there is no abscess formation and there is improvement on CT then we could consider discharge on oral antibiotics.  He is tolerating a diet.  Florene Glen, MD, FACS  06/24/2017

## 2017-06-24 NOTE — Progress Notes (Addendum)
Pt. Peripheral IV seemed to be infiltrated. Pt. c/o of pain when I flushed twice. Pt. does not think that it is not working. He said that it has been hurting ever since and for couple of days.He does not want another IV re-started but per my judgement, it is probably bad. I consulted IV team anyway to re-assess the site. Per Dr. Burt Knack, he needs functioning IV for IV contrast and antibiotic.

## 2017-06-25 LAB — CBC WITH DIFFERENTIAL/PLATELET
Basophils Absolute: 0.1 10*3/uL (ref 0–0.1)
Basophils Relative: 1 %
Eosinophils Absolute: 0.7 10*3/uL (ref 0–0.7)
Eosinophils Relative: 6 %
HEMATOCRIT: 42.7 % (ref 40.0–52.0)
Hemoglobin: 13.9 g/dL (ref 13.0–18.0)
LYMPHS PCT: 10 %
Lymphs Abs: 1.1 10*3/uL (ref 1.0–3.6)
MCH: 27.7 pg (ref 26.0–34.0)
MCHC: 32.6 g/dL (ref 32.0–36.0)
MCV: 84.8 fL (ref 80.0–100.0)
MONO ABS: 1.3 10*3/uL — AB (ref 0.2–1.0)
MONOS PCT: 11 %
NEUTROS ABS: 8.3 10*3/uL — AB (ref 1.4–6.5)
Neutrophils Relative %: 72 %
Platelets: 276 10*3/uL (ref 150–440)
RBC: 5.03 MIL/uL (ref 4.40–5.90)
RDW: 13.6 % (ref 11.5–14.5)
WBC: 11.4 10*3/uL — ABNORMAL HIGH (ref 3.8–10.6)

## 2017-06-25 MED ORDER — HYDROCODONE-ACETAMINOPHEN 5-325 MG PO TABS: 1.0000 | ORAL_TABLET | ORAL | 0 refills | Status: DC | PRN

## 2017-06-25 MED ORDER — AMOXICILLIN-POT CLAVULANATE 875-125 MG PO TABS: 1.0000 | ORAL_TABLET | Freq: Two times a day (BID) | ORAL | 1 refills | Status: DC

## 2017-06-25 NOTE — Progress Notes (Signed)
CC: Appendicitis Subjective: This patient with known appendicitis being treated with IV antibiotics.  His CT scan yesterday showed improvement minimally but certainly no worsening and no sign of drainable abscess.  He feels better today and wants to be discharged on oral antibiotics.  He is tolerating a regular diet and denies fevers or chills.  Objective: Vital signs in last 24 hours: Temp:  [97.6 F (36.4 C)-98.3 F (36.8 C)] 97.6 F (36.4 C) (01/17 0528) Pulse Rate:  [78-88] 78 (01/17 0528) Resp:  [17] 17 (01/16 2036) BP: (132-138)/(74-85) 132/85 (01/17 0528) SpO2:  [90 %-98 %] 90 % (01/17 0528) Last BM Date: 06/23/17  Intake/Output from previous day: 01/16 0701 - 01/17 0700 In: 820 [P.O.:720; IV Piggyback:100] Out: 525 [Urine:525] Intake/Output this shift: No intake/output data recorded.  Physical exam:  Vital signs stable and reviewed afebrile abdomen is soft less tender in the right lower quadrant no peritoneal signs nontender calves moderate edema Lab Results: CBC  Recent Labs    06/24/17 0442 06/25/17 0558  WBC 15.1* 11.4*  HGB 13.7 13.9  HCT 41.6 42.7  PLT 259 276   BMET Recent Labs    06/24/17 0442  NA 137  K 3.7  CL 98*  CO2 29  GLUCOSE 123*  BUN 8  CREATININE 0.78  CALCIUM 8.2*   PT/INR No results for input(s): LABPROT, INR in the last 72 hours. ABG No results for input(s): PHART, HCO3 in the last 72 hours.  Invalid input(s): PCO2, PO2  Studies/Results: Ct Abdomen Pelvis W Contrast  Result Date: 06/24/2017 CLINICAL DATA:  Follow-up for appendicitis. EXAM: CT ABDOMEN AND PELVIS WITH CONTRAST TECHNIQUE: Multidetector CT imaging of the abdomen and pelvis was performed using the standard protocol following bolus administration of intravenous contrast. CONTRAST:  38mL ISOVUE-370 IOPAMIDOL (ISOVUE-370) INJECTION 76% COMPARISON:  CT abdomen pelvis dated June 20, 2017. FINDINGS: Lower chest: New trace right pleural effusion. Hepatobiliary: No focal  liver abnormality is seen. No gallstones, gallbladder wall thickening, or biliary dilatation. Pancreas: Unremarkable. No pancreatic ductal dilatation or surrounding inflammatory changes. Spleen: Normal in size without focal abnormality. Adrenals/Urinary Tract: Adrenal glands are unremarkable. Mild prominence of the left renal collecting system is unchanged. Kidneys are otherwise normal, without renal calculi, focal lesion, or hydronephrosis. Bladder is unremarkable. Stomach/Bowel: The appendix remains dilated up to 22 mm, with prominent mucosal hyperenhancement, and unchanged appendicolith at the base. A few foci of extraluminal gas are again seen adjacent to the terminal ileum. Small amount of periappendiceal free fluid again noted, without drainable fluid collection. U axilla nchanged severe wall thickening of the terminal ileum and cecum. Moderate left-sided colonic diverticulosis. Vascular/Lymphatic: No significant vascular findings are present. No enlarged abdominal or pelvic lymph nodes. Reproductive: Prostate is unremarkable. Other: None. Musculoskeletal: No acute or significant osseous findings. IMPRESSION: 1. Relatively unchanged findings related to acute appendicitis with severe periappendiceal inflammatory changes and evidence of focal perforation. Slightly increased extraluminal gas. No discrete drainable fluid collection. 2. New trace right pleural effusion. Electronically Signed   By: Titus Dubin M.D.   On: 06/24/2017 14:05    Anti-infectives: Anti-infectives (From admission, onward)   Start     Dose/Rate Route Frequency Ordered Stop   06/25/17 0000  amoxicillin-clavulanate (AUGMENTIN) 875-125 MG tablet     1 tablet Oral 2 times daily 06/25/17 0711     06/20/17 2200  piperacillin-tazobactam (ZOSYN) IVPB 3.375 g     3.375 g 12.5 mL/hr over 240 Minutes Intravenous Every 8 hours 06/20/17 1722  06/20/17 1500  piperacillin-tazobactam (ZOSYN) IVPB 3.375 g     3.375 g 100 mL/hr over 30  Minutes Intravenous  Once 06/20/17 1456 06/20/17 1555      Assessment/Plan:  White blood cell count is improved at 11.  Patient is doing well and showing no signs of abscess formation on CT scan.  We will change him to oral antibiotics and discharged today with instructions to return should he have worsening pain or fevers or nausea or vomiting.  He will follow-up in our office in 10 days and continue on antibiotics until then.  Florene Glen, MD, FACS  06/25/2017

## 2017-06-25 NOTE — Discharge Instructions (Signed)
° °  Appendicitis The appendix is a tube that is shaped like a finger. It is connected to the large intestine. Appendicitis means that this tube is swollen (inflamed). Without treatment, the tube can tear (rupture). This can lead to a life-threatening infection. It can also cause you to have sores (abscesses). These sores hurt. What are the causes? This condition may be caused by something that blocks the appendix, such as:  A ball of poop (stool).  Lymph glands that are bigger than normal.  Sometimes, the cause is not known. What are the signs or symptoms? Symptoms of this condition include:  Pain around the belly button (navel). ? The pain moves toward the lower right belly (abdomen). ? The pain can get worse with time. ? The pain can get worse if you cough. ? The pain can get worse if you move suddenly.  Tenderness in the lower right belly.  Feeling sick to your stomach (nauseous).  Throwing up (vomiting).  Not feeling hungry (loss of appetite).  A fever.  Having a hard time pooping (constipation).  Watery poop (diarrhea).  Not feeling well.  How is this treated? Usually, this condition is treated by taking out the appendix (appendectomy). There are two ways that the appendix can be taken out:  Open surgery. In this surgery, the appendix is taken out through a large cut (incision). The cut is made in the lower right belly. This surgery may be picked if: ? You have scars from another surgery. ? You have a bleeding condition. ? You are pregnant and will be having your baby soon. ? You have a condition that does not allow the other type of surgery.  Laparoscopic surgery. In this surgery, the appendix is taken out through small cuts. Often, this surgery: ? Causes less pain. ? Causes fewer problems. ? Is easier to heal from.  If your appendix tears and a sore forms:  A drain may be put into the sore. The drain will be used to get rid of fluid.  You may get an  antibiotic medicine through an IV tube.  Your appendix may or may not need to be taken out.  This information is not intended to replace advice given to you by your health care provider. Make sure you discuss any questions you have with your health care provider. Document Released: 08/18/2011 Document Revised: 11/01/2015 Document Reviewed: 10/11/2014 Elsevier Interactive Patient Education  2018 [name] shower Take oral analgesics as needed Oral antibiotics Resume all home meds F/u with [facility] in 10 days

## 2017-06-25 NOTE — Discharge Summary (Signed)
Physician Discharge Summary  Patient ID: Richard Gallagher MRN: [id number] DOB/AGE: Feb 15, 1956 62 y.o.  Admit date: 06/20/2017 Discharge date: 06/25/2017   Discharge Diagnoses:  Active Problems:   Ruptured appendicitis   Acute appendicitis   Procedures: None  Hospital Course: This a patient with appendicitis who is admitted to the hospital with considerable inflammation and phlegmon in the area of the right lower quadrant on CT scan.  He was started on IV antibiotics and surgery was deferred on admission.  He has improved and a repeat CT scan failed to identify any sign of abscess.  He is transition to oral antibiotics and discharged in stable condition to follow-up in our office in 10 days he is instructed to return to the emergency room should he have increasing pain malaise fevers or nausea and vomiting.  He may resume his home medications as well as his normal activities.  Consults: None  Disposition:    Allergies as of 06/25/2017   No Known Allergies     Medication List    TAKE these medications   amoxicillin-clavulanate 875-125 MG tablet Commonly known as:  AUGMENTIN Take 1 tablet by mouth 2 (two) times daily.   aspirin EC 81 MG tablet Take 81 mg by mouth daily.   atenolol 50 MG tablet Commonly known as:  TENORMIN Take 50 mg by mouth daily.   Fish Oil 1000 MG Caps Take 1,000 mg by mouth daily.   hydrochlorothiazide 25 MG tablet Commonly known as:  HYDRODIURIL Take 25 mg by mouth daily.   HYDROcodone-acetaminophen 5-325 MG tablet Commonly known as:  NORCO/VICODIN Take 1 tablet by mouth every 4 (four) hours as needed for moderate pain.   losartan 25 MG tablet Commonly known as:  COZAAR Take 25 mg by mouth daily.   ZINC PO Take 1 tablet by mouth daily.   zolpidem 5 MG tablet Commonly known as:  AMBIEN Take 5 mg by mouth at bedtime as needed.      Follow-up Information    Florene Glen, MD Follow up in 10 day(s).   Specialty:  Surgery Contact  information: [place] [id number] [phone]           Florene Glen, MD, FACS

## 2017-06-25 NOTE — Progress Notes (Signed)
Benancio Deeds to be D/C'd home  per MD order.  Discussed prescriptions and follow up appointments with the patient. Prescriptions given to patient, medication list explained in detail. Pt verbalized understanding.  Allergies as of 06/25/2017   No Known Allergies     Medication List    TAKE these medications   amoxicillin-clavulanate 875-125 MG tablet Commonly known as:  AUGMENTIN Take 1 tablet by mouth 2 (two) times daily.   aspirin EC 81 MG tablet Take 81 mg by mouth daily.   atenolol 50 MG tablet Commonly known as:  TENORMIN Take 50 mg by mouth daily.   Fish Oil 1000 MG Caps Take 1,000 mg by mouth daily.   hydrochlorothiazide 25 MG tablet Commonly known as:  HYDRODIURIL Take 25 mg by mouth daily.   HYDROcodone-acetaminophen 5-325 MG tablet Commonly known as:  NORCO/VICODIN Take 1 tablet by mouth every 4 (four) hours as needed for moderate pain.   losartan 25 MG tablet Commonly known as:  COZAAR Take 25 mg by mouth daily.   ZINC PO Take 1 tablet by mouth daily.   zolpidem 5 MG tablet Commonly known as:  AMBIEN Take 5 mg by mouth at bedtime as needed.       Vitals:   06/24/17 2036 06/25/17 0528  BP: 138/74 132/85  Pulse: 88 78  Resp: 17   Temp: 98.2 F (36.8 C) 97.6 F (36.4 C)  SpO2: 91% 90%    Skin clean, dry and intact without evidence of skin break down, no evidence of skin tears noted. IV catheter discontinued intact. Site without signs and symptoms of complications. Dressing and pressure applied. Pt denies pain at this time. No complaints noted.  An After Visit Summary was printed and given to the patient. Patient escorted via [place], and D/C home via private auto.  Darvin Dials A

## 2017-06-29 ENCOUNTER — Inpatient Hospital Stay: Admission: RE | Admit: 2017-06-29 | Payer: BLUE CROSS/BLUE SHIELD | Source: Ambulatory Visit

## 2017-07-03 ENCOUNTER — Encounter: Payer: Self-pay | Admitting: Surgery

## 2017-07-03 ENCOUNTER — Ambulatory Visit (INDEPENDENT_AMBULATORY_CARE_PROVIDER_SITE_OTHER): Payer: BLUE CROSS/BLUE SHIELD | Admitting: Surgery

## 2017-07-03 VITALS — BP 117/80 | HR 60 | Temp 98.7°F | Ht 69.0 in | Wt 246.0 lb

## 2017-07-03 DIAGNOSIS — K3580 Unspecified acute appendicitis: Secondary | ICD-10-CM | POA: Diagnosis not present

## 2017-07-03 DIAGNOSIS — R1084 Generalized abdominal pain: Secondary | ICD-10-CM | POA: Diagnosis not present

## 2017-07-03 MED ORDER — AMOXICILLIN-POT CLAVULANATE 875-125 MG PO TABS: 1.0000 | ORAL_TABLET | Freq: Two times a day (BID) | ORAL | 1 refills | Status: DC

## 2017-07-03 NOTE — Patient Instructions (Addendum)
We have placed a referral today to [facility] in regards to your Colonoscopy. We will call you with an appointment as soon as it has been made. This process typically takes 5-7 business days. If you do not hear from our office after that time period, please give our office a call so that we may check on the status for you.   We have scheduled you for a CT Scan of your Abdomen and Pelvis. This has been scheduled at 1:45 at our [facility] location. Please Check-in at 1:30, 15 minutes prior to your scheduled appointment. If you need to reschedule your Scan, you may do so by calling [phone].  You will need to pick up a prep kit at least 24 hours in advance of your Scan: You may pick this up at the [place] department at [facility], or [employer].  Bring a list of medications with you to your appointment and you may have nothing to eat or drink 4 hours prior to your CT Scan.    We have also sent in a refill of your antibiotics as well.   We will see you back in office as listed below:  If you have any questions or concerns please give our office a call.

## 2017-07-03 NOTE — Progress Notes (Signed)
Outpatient Surgical Follow Up  07/03/2017  Richard Gallagher is an 62 y.o. male.   CC: Acute appendicitis.  HPI: This patient was treated in the hospital for acute appendicitis with likely rupture.  He was treated with IV antibiotics.  His condition improved while on IV antibiotics and a follow-up CT scan in the hospital failed to identify any abscess.  Patient describes occasional cramping abdominal pain that lasts 30-60 minutes but in between he is completely pain-free.  He denies fevers or chills.  No nausea or vomiting.  He has lost some weight but is eating well at this point with normal bowel movements no diarrhea.  Past Medical History:  Diagnosis Date  . Acute appendicitis   . DOE (dyspnea on exertion) 02/04/2017  . Essential hypertension 02/04/2017  . Hypertension   . Moderate obesity    Had been better controlled while he was living in [place]. He gained 30 pounds since returning in January 2018  . Right bundle branch block (RBBB) on electrocardiogram (ECG) 05/2016  . Ruptured appendicitis 06/20/2017  . Thoracic aortic aneurysm without rupture ([place]) 05/2016   Noted on Cardiac Cath with TAA Angio - Aortic Root 5.1 cm    Past Surgical History:  Procedure Laterality Date  . BACK SURGERY    . CARDIAC CATHETERIZATION  05/2016    ([place], [place]. Dr. Modesto Charon) 05/22/16: EF 55%. Normal coronary arteries. Dilated aortic root at 5.1 cm. 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 codominant  . ROTATOR CUFF REPAIR Left     Family History  Problem Relation Age of Onset  . Hypertension Mother   . Diabetes Mother   . Heart disease Mother        He is not sure of details  . Arthritis Mother   . Hypertension Father   . Diabetes Father   . Arthritis Father   . Stroke Brother     Social History:  reports that  has never smoked. he has never used smokeless tobacco. He reports that he drinks alcohol. He reports that he does not use  drugs.  Allergies: No Known Allergies  Medications reviewed.   Review of Systems:   Review of Systems  Constitutional: Negative for chills and fever.  HENT: Negative.   Eyes: Negative.   Respiratory: Negative.   Cardiovascular: Negative.   Gastrointestinal: Positive for abdominal pain. Negative for blood in stool, constipation, diarrhea, heartburn, nausea and vomiting.  Genitourinary: Negative.   Musculoskeletal: Negative.   Skin: Negative.   Neurological: Negative.   Endo/Heme/Allergies: Negative.   Psychiatric/Behavioral: Negative.      Physical Exam:  BP 117/80   Pulse 60   Temp 98.7 F (37.1 C) (Oral)   Ht 5\' 9"  (1.753 m)   Wt 246 lb (111.6 kg)   BMI 36.33 kg/m   Physical Exam    No results found for this or any previous visit (from the past 48 hour(s)). No results found.  Assessment/Plan:  Patient has never had a colonoscopy.  He is being treated for ruptured appendicitis.  I believe that he will need a colonoscopy at some point to ensure that this is not a complication of a cecal mass or colon cancer.  It is difficult to tell on the CT scan but this is likely simple appendicitis.  Ultimately he will also require appendectomy as he is at risk for this happening again.  I will refill his antibiotics at this time.  Repeat a CT scan  next week and follow-up with Dr. Adonis Huguenin next week.  Florene Glen, MD, FACS

## 2017-07-08 ENCOUNTER — Ambulatory Visit: Admission: RE | Admit: 2017-07-08 | Payer: BLUE CROSS/BLUE SHIELD | Source: Ambulatory Visit

## 2017-07-09 ENCOUNTER — Ambulatory Visit: Payer: BLUE CROSS/BLUE SHIELD

## 2017-07-09 DIAGNOSIS — M6281 Muscle weakness (generalized): Secondary | ICD-10-CM | POA: Diagnosis not present

## 2017-07-09 DIAGNOSIS — M25612 Stiffness of left shoulder, not elsewhere classified: Secondary | ICD-10-CM | POA: Diagnosis not present

## 2017-07-09 DIAGNOSIS — M25512 Pain in left shoulder: Secondary | ICD-10-CM | POA: Diagnosis not present

## 2017-07-14 DIAGNOSIS — M6281 Muscle weakness (generalized): Secondary | ICD-10-CM | POA: Diagnosis not present

## 2017-07-14 DIAGNOSIS — M25612 Stiffness of left shoulder, not elsewhere classified: Secondary | ICD-10-CM | POA: Diagnosis not present

## 2017-07-14 DIAGNOSIS — M25512 Pain in left shoulder: Secondary | ICD-10-CM | POA: Diagnosis not present

## 2017-07-15 ENCOUNTER — Ambulatory Visit
Admission: RE | Admit: 2017-07-15 | Discharge: 2017-07-15 | Disposition: A | Discharge status: Home / Self Care | Payer: BLUE CROSS/BLUE SHIELD | Source: Ambulatory Visit | Attending: Surgery | Admitting: Surgery

## 2017-07-15 DIAGNOSIS — K5730 Diverticulosis of large intestine without perforation or abscess without bleeding: Secondary | ICD-10-CM | POA: Diagnosis not present

## 2017-07-15 DIAGNOSIS — R109 Unspecified abdominal pain: Secondary | ICD-10-CM | POA: Diagnosis not present

## 2017-07-15 DIAGNOSIS — K760 Fatty (change of) liver, not elsewhere classified: Secondary | ICD-10-CM | POA: Diagnosis not present

## 2017-07-15 DIAGNOSIS — J9811 Atelectasis: Secondary | ICD-10-CM | POA: Insufficient documentation

## 2017-07-15 DIAGNOSIS — R1084 Generalized abdominal pain: Secondary | ICD-10-CM

## 2017-07-15 MED ORDER — IOPAMIDOL (ISOVUE-300) INJECTION 61%
100.0000 mL | Freq: Once | INTRAVENOUS | Status: AC | PRN
  Administered 2017-07-15: 100 mL via INTRAVENOUS

## 2017-07-16 DIAGNOSIS — M25612 Stiffness of left shoulder, not elsewhere classified: Secondary | ICD-10-CM | POA: Diagnosis not present

## 2017-07-16 DIAGNOSIS — M6281 Muscle weakness (generalized): Secondary | ICD-10-CM | POA: Diagnosis not present

## 2017-07-16 DIAGNOSIS — M25512 Pain in left shoulder: Secondary | ICD-10-CM | POA: Diagnosis not present

## 2017-07-20 DIAGNOSIS — M25512 Pain in left shoulder: Secondary | ICD-10-CM | POA: Diagnosis not present

## 2017-07-20 DIAGNOSIS — M6281 Muscle weakness (generalized): Secondary | ICD-10-CM | POA: Diagnosis not present

## 2017-07-20 DIAGNOSIS — M25612 Stiffness of left shoulder, not elsewhere classified: Secondary | ICD-10-CM | POA: Diagnosis not present

## 2017-07-23 DIAGNOSIS — M25612 Stiffness of left shoulder, not elsewhere classified: Secondary | ICD-10-CM | POA: Diagnosis not present

## 2017-07-23 DIAGNOSIS — M25512 Pain in left shoulder: Secondary | ICD-10-CM | POA: Diagnosis not present

## 2017-07-23 DIAGNOSIS — M6281 Muscle weakness (generalized): Secondary | ICD-10-CM | POA: Diagnosis not present

## 2017-07-27 ENCOUNTER — Telehealth: Payer: Self-pay | Admitting: Surgery

## 2017-07-27 NOTE — Telephone Encounter (Signed)
Left message for patient to call office regarding CT results and to let him know I will check on the referral to [facility].

## 2017-07-27 NOTE — Telephone Encounter (Signed)
Patient's wife said that they have not heard results from CT scan or status on referral for GI. Please advise

## 2017-07-28 DIAGNOSIS — M6281 Muscle weakness (generalized): Secondary | ICD-10-CM | POA: Diagnosis not present

## 2017-07-28 DIAGNOSIS — M25512 Pain in left shoulder: Secondary | ICD-10-CM | POA: Diagnosis not present

## 2017-07-28 DIAGNOSIS — M25612 Stiffness of left shoulder, not elsewhere classified: Secondary | ICD-10-CM | POA: Diagnosis not present

## 2017-07-28 NOTE — Telephone Encounter (Signed)
Spoke with patients wife and discussed CT results. Patient will call back today to see if we have heard from the referral to [facility].

## 2017-07-30 DIAGNOSIS — M25512 Pain in left shoulder: Secondary | ICD-10-CM | POA: Diagnosis not present

## 2017-07-30 DIAGNOSIS — M6281 Muscle weakness (generalized): Secondary | ICD-10-CM | POA: Diagnosis not present

## 2017-07-30 DIAGNOSIS — M25612 Stiffness of left shoulder, not elsewhere classified: Secondary | ICD-10-CM | POA: Diagnosis not present

## 2017-07-31 ENCOUNTER — Other Ambulatory Visit: Payer: Self-pay

## 2017-07-31 ENCOUNTER — Telehealth: Payer: Self-pay

## 2017-07-31 DIAGNOSIS — K3580 Unspecified acute appendicitis: Secondary | ICD-10-CM

## 2017-07-31 MED ORDER — PEG 3350-KCL-NABCB-NACL-NASULF 236 G PO SOLR: ORAL | 0 refills | Status: DC

## 2017-07-31 MED ORDER — NA SULFATE-K SULFATE-MG SULF 17.5-3.13-1.6 GM/177ML PO SOLN: 1.0000 | Freq: Once | ORAL | 0 refills | Status: AC

## 2017-07-31 NOTE — Telephone Encounter (Signed)
Tried reaching patient at this time regarding cancellation of CT A&P multiple times as well as follow up appointment with Dr.Woodham.  We will need to get these scheduled again.  Patient is scheduled for Colonoscopy 08/04/17.

## 2017-07-31 NOTE — Telephone Encounter (Signed)
Gastroenterology Pre-Procedure Review  Request Date: 02/26 Requesting Physician: Dr. Marius Ditch  PATIENT REVIEW QUESTIONS: The patient responded to the following health history questions as indicated:    1. Are you having any GI issues? appendicitis, ruptured appendix 2. Do you have a personal history of Polyps? no 3. Do you have a family history of Colon Cancer or Polyps? no 4. Diabetes Mellitus? no 5. Joint replacements in the past 12 months?November 2018 Left Shoulder Rotator Cuff 6. Major health problems in the past 3 months?yes (Appendicitis) 7. Any artificial heart valves, MVP, or defibrillator?Enlarged Aorta    MEDICATIONS & ALLERGIES:    Patient reports the following regarding taking any anticoagulation/antiplatelet therapy:   Plavix, Coumadin, Eliquis, Xarelto, Lovenox, Pradaxa, Brilinta, or Effient? no Aspirin? yes (baby aspirin 81 mg)  Patient confirms/reports the following medications:  Current Outpatient Medications  Medication Sig Dispense Refill  . amoxicillin-clavulanate (AUGMENTIN) 875-125 MG tablet Take 1 tablet by mouth 2 (two) times daily. 20 tablet 1  . aspirin EC 81 MG tablet Take 81 mg by mouth daily.    Marland Kitchen atenolol (TENORMIN) 50 MG tablet Take 50 mg by mouth daily.  0  . hydrochlorothiazide (HYDRODIURIL) 25 MG tablet Take 25 mg by mouth daily.  0  . HYDROcodone-acetaminophen (NORCO/VICODIN) 5-325 MG tablet Take 1 tablet by mouth every 4 (four) hours as needed for moderate pain. 30 tablet 0  . losartan (COZAAR) 25 MG tablet Take 25 mg by mouth daily.  0  . Multiple Vitamins-Minerals (ZINC PO) Take 1 tablet by mouth daily.    . Na Sulfate-K Sulfate-Mg Sulf 17.5-3.13-1.6 GM/177ML SOLN Take 1 kit by mouth once for 1 dose. 354 mL 0  . Omega-3 Fatty Acids (FISH OIL) 1000 MG CAPS Take 1,000 mg by mouth daily.    Marland Kitchen zolpidem (AMBIEN) 5 MG tablet Take 5 mg by mouth at bedtime as needed.  1   No current facility-administered medications for this visit.     Patient  confirms/reports the following allergies:  No Known Allergies  No orders of the defined types were placed in this encounter.   AUTHORIZATION INFORMATION Primary Insurance: 1D#: Group #:  Secondary Insurance: 1D#: Group #:  SCHEDULE INFORMATION: Date: 08/04/17 Time: Location:[facility]

## 2017-08-04 ENCOUNTER — Ambulatory Visit
Admission: RE | Admit: 2017-08-04 | Discharge: 2017-08-04 | Disposition: A | Discharge status: Home / Self Care | Payer: BLUE CROSS/BLUE SHIELD | Source: Ambulatory Visit | Attending: Gastroenterology | Admitting: Gastroenterology

## 2017-08-04 ENCOUNTER — Encounter
Admission: RE | Disposition: A | Discharge status: Home / Self Care | Payer: Self-pay | Source: Ambulatory Visit | Attending: Gastroenterology

## 2017-08-04 ENCOUNTER — Ambulatory Visit: Payer: BLUE CROSS/BLUE SHIELD | Admitting: Anesthesiology

## 2017-08-04 ENCOUNTER — Other Ambulatory Visit: Payer: Self-pay

## 2017-08-04 DIAGNOSIS — I10 Essential (primary) hypertension: Secondary | ICD-10-CM | POA: Diagnosis not present

## 2017-08-04 DIAGNOSIS — D125 Benign neoplasm of sigmoid colon: Secondary | ICD-10-CM | POA: Insufficient documentation

## 2017-08-04 DIAGNOSIS — R1031 Right lower quadrant pain: Secondary | ICD-10-CM | POA: Diagnosis not present

## 2017-08-04 DIAGNOSIS — K5730 Diverticulosis of large intestine without perforation or abscess without bleeding: Secondary | ICD-10-CM | POA: Insufficient documentation

## 2017-08-04 DIAGNOSIS — K37 Unspecified appendicitis: Secondary | ICD-10-CM | POA: Diagnosis not present

## 2017-08-04 DIAGNOSIS — E669 Obesity, unspecified: Secondary | ICD-10-CM | POA: Diagnosis not present

## 2017-08-04 DIAGNOSIS — K529 Noninfective gastroenteritis and colitis, unspecified: Secondary | ICD-10-CM | POA: Diagnosis not present

## 2017-08-04 DIAGNOSIS — Z6835 Body mass index (BMI) 35.0-35.9, adult: Secondary | ICD-10-CM | POA: Diagnosis not present

## 2017-08-04 DIAGNOSIS — Z7982 Long term (current) use of aspirin: Secondary | ICD-10-CM | POA: Diagnosis not present

## 2017-08-04 DIAGNOSIS — Z8261 Family history of arthritis: Secondary | ICD-10-CM | POA: Diagnosis not present

## 2017-08-04 DIAGNOSIS — K3580 Unspecified acute appendicitis: Secondary | ICD-10-CM

## 2017-08-04 DIAGNOSIS — K5000 Crohn's disease of small intestine without complications: Secondary | ICD-10-CM | POA: Diagnosis not present

## 2017-08-04 DIAGNOSIS — I712 Thoracic aortic aneurysm, without rupture: Secondary | ICD-10-CM | POA: Diagnosis not present

## 2017-08-04 DIAGNOSIS — Z823 Family history of stroke: Secondary | ICD-10-CM | POA: Insufficient documentation

## 2017-08-04 DIAGNOSIS — Z79899 Other long term (current) drug therapy: Secondary | ICD-10-CM | POA: Diagnosis not present

## 2017-08-04 DIAGNOSIS — I739 Peripheral vascular disease, unspecified: Secondary | ICD-10-CM | POA: Diagnosis not present

## 2017-08-04 DIAGNOSIS — Z833 Family history of diabetes mellitus: Secondary | ICD-10-CM | POA: Insufficient documentation

## 2017-08-04 DIAGNOSIS — D122 Benign neoplasm of ascending colon: Secondary | ICD-10-CM | POA: Insufficient documentation

## 2017-08-04 DIAGNOSIS — I4510 Unspecified right bundle-branch block: Secondary | ICD-10-CM | POA: Insufficient documentation

## 2017-08-04 DIAGNOSIS — K635 Polyp of colon: Secondary | ICD-10-CM | POA: Diagnosis not present

## 2017-08-04 DIAGNOSIS — Z8249 Family history of ischemic heart disease and other diseases of the circulatory system: Secondary | ICD-10-CM | POA: Diagnosis not present

## 2017-08-04 HISTORY — PX: COLONOSCOPY WITH PROPOFOL: SHX5780

## 2017-08-04 SURGERY — COLONOSCOPY WITH PROPOFOL
Anesthesia: General

## 2017-08-04 MED ORDER — SODIUM CHLORIDE 0.9 % IV SOLN
INTRAVENOUS | Status: DC | PRN
  Administered 2017-08-04: 14:00:00 via INTRAVENOUS

## 2017-08-04 MED ORDER — EPINEPHRINE PF 1 MG/10ML IJ SOSY
PREFILLED_SYRINGE | INTRAMUSCULAR | Status: AC
  Filled 2017-08-04: qty 10

## 2017-08-04 MED ORDER — LABETALOL HCL 5 MG/ML IV SOLN
INTRAVENOUS | Status: AC
  Filled 2017-08-04: qty 4

## 2017-08-04 MED ORDER — PROPOFOL 10 MG/ML IV BOLUS
INTRAVENOUS | Status: DC | PRN
  Administered 2017-08-04 (×3): 50 mg via INTRAVENOUS

## 2017-08-04 MED ORDER — SODIUM CHLORIDE 0.9 % IV SOLN
INTRAVENOUS | Status: DC
  Administered 2017-08-04: 14:00:00 via INTRAVENOUS

## 2017-08-04 MED ORDER — PROPOFOL 500 MG/50ML IV EMUL
INTRAVENOUS | Status: AC
  Filled 2017-08-04: qty 50

## 2017-08-04 MED ORDER — LABETALOL HCL 5 MG/ML IV SOLN
INTRAVENOUS | Status: DC | PRN
  Administered 2017-08-04 (×2): 5 mg via INTRAVENOUS

## 2017-08-04 MED ORDER — LIDOCAINE HCL (PF) 2 % IJ SOLN
INTRAMUSCULAR | Status: AC
  Filled 2017-08-04: qty 10

## 2017-08-04 MED ORDER — LIDOCAINE HCL (CARDIAC) 20 MG/ML IV SOLN
INTRAVENOUS | Status: DC | PRN
  Administered 2017-08-04 (×2): 50 mg via INTRATRACHEAL

## 2017-08-04 MED ORDER — PROPOFOL 500 MG/50ML IV EMUL
INTRAVENOUS | Status: DC | PRN
  Administered 2017-08-04: 150 ug/kg/min via INTRAVENOUS
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
## 2017-08-04 NOTE — Transfer of Care (Signed)
Immediate Anesthesia Transfer of Care Note  Patient: Richard Gallagher  Procedure(s) Performed: COLONOSCOPY WITH PROPOFOL (N/A )  Patient Location: PACU  Anesthesia Type:General  Level of Consciousness: sedated  Airway & Oxygen Therapy: Patient Spontanous Breathing and Patient connected to nasal cannula oxygen  Post-op Assessment: Report given to RN and Post -op Vital signs reviewed and stable  Post vital signs: Reviewed and stable  Last Vitals:  Vitals:   08/04/17 1347 08/04/17 1539  BP: (!) 156/97 (!) 130/95  Pulse: 87 72  Resp: (!) 22 (!) 26  Temp: 36.4 C (!) 36.2 C  SpO2: 97% 94%    Last Pain:  Vitals:   08/04/17 1539  TempSrc: Tympanic      Patients Stated Pain Goal: 7 (74/93/55 2174)  Complications: No apparent anesthesia complications

## 2017-08-04 NOTE — Anesthesia Postprocedure Evaluation (Signed)
Anesthesia Post Note  Patient: Richard Gallagher  Procedure(s) Performed: COLONOSCOPY WITH PROPOFOL (N/A )  Patient location during evaluation: Endoscopy Anesthesia Type: General Level of consciousness: awake and alert Pain management: pain level controlled Vital Signs Assessment: post-procedure vital signs reviewed and stable Respiratory status: spontaneous breathing, nonlabored ventilation, respiratory function stable and patient connected to nasal cannula oxygen Cardiovascular status: blood pressure returned to baseline and stable Postop Assessment: no apparent nausea or vomiting Anesthetic complications: no     Last Vitals:  Vitals:   08/04/17 1559 08/04/17 1609  BP: (!) 151/91 (!) 147/92  Pulse: 70 75  Resp: 16 18  Temp:    SpO2: 96% 94%    Last Pain:  Vitals:   08/04/17 1539  TempSrc: Tympanic                 Precious Haws Piscitello

## 2017-08-04 NOTE — Op Note (Signed)
[facility] Gastroenterology Patient Name: Richard Gallagher Procedure Date: 08/04/2017 2:09 PM MRN: [id number] Account #: [id number] Date of Birth: June 01, 1956 Admit Type: Outpatient Age: 62 Room: [facility] ENDO ROOM 4 Gender: Male Note Status: Finalized Procedure:            Colonoscopy Indications:          This is the patient's first colonoscopy, , Abdominal                        pain in the right lower quadrant, history of perforated                        appendix Providers:            Lin Landsman MD, MD Medicines:            Monitored Anesthesia Care Complications:        No immediate complications. Estimated blood loss:                        Minimal. Procedure:            Pre-Anesthesia Assessment:                       - Prior to the procedure, a History and Physical was                        performed, and patient medications and allergies were                        reviewed. The patient is competent. The risks and                        benefits of the procedure and the sedation options and                        risks were discussed with the patient. All questions                        were answered and informed consent was obtained.                        Patient identification and proposed procedure were                        verified by the physician, the nurse, the                        anesthesiologist, the anesthetist and the technician in                        the pre-procedure area in the procedure room in the                        endoscopy suite. Mental Status Examination: alert and                        oriented. Airway Examination: normal oropharyngeal                        airway and neck  mobility. Respiratory Examination:                        clear to auscultation. CV Examination: normal.                        Prophylactic Antibiotics: The patient does not require                        prophylactic antibiotics. Prior  Anticoagulants: The                        patient has taken aspirin, last dose was 1 day prior to                        procedure. ASA Grade Assessment: II - A patient with                        mild systemic disease. After reviewing the risks and                        benefits, the patient was deemed in satisfactory                        condition to undergo the procedure. The anesthesia plan                        was to use monitored anesthesia care (MAC). Immediately                        prior to administration of medications, the patient was                        re-assessed for adequacy to receive sedatives. The                        heart rate, respiratory rate, oxygen saturations, blood                        pressure, adequacy of pulmonary ventilation, and                        response to care were monitored throughout the                        procedure. The physical status of the patient was                        re-assessed after the procedure.                       After obtaining informed consent, the colonoscope was                        passed under direct vision. Throughout the procedure,                        the patient's blood pressure, pulse, and oxygen  saturations were monitored continuously. The                        Colonoscope was introduced through the anus and                        advanced to the 5 cm into the ileum. The colonoscopy                        was performed without difficulty. The patient tolerated                        the procedure well. The quality of the bowel                        preparation was evaluated using the BBPS [facility] Bowel                        Preparation Scale) with scores of: Right Colon = 3,                        Transverse Colon = 3 and Left Colon = 3 (entire mucosa                        seen well with no residual staining, small fragments of                        stool or opaque  liquid). The total BBPS score equals 9. Findings:      The perianal and digital rectal examinations were normal. Pertinent       negatives include normal sphincter tone and no palpable rectal lesions.      Inflammation, mild in severity and characterized by congestion (edema),       friability and confluent ulcerations was found in the terminal ileum.       Biopsies were taken with a cold forceps for histology.      Appedicieal orifice has polypoid tissue protruding from orifice appeared       like granulation tissue, biopsies not attempted given recent perforated       appendix      A 10 mm polyp was found in the proximal ascending colon. The polyp was       sessile. The polyp was removed with a hot snare in retroflexed view in 2       pieces. Resection and retrieval were complete. To repair the defect, the       tissue edges were approximated and three hemostatic clips were       successfully placed. Closure of the defect was successful. There was no       bleeding at the end of the procedure.      A 6 mm polyp was found in the distal ascending colon. The polyp was       sessile. The polyp was removed with a hot snare. Resection and retrieval       were complete.      A diminutive polyp was found in the ascending colon. The polyp was       sessile. The polyp was removed with a cold biopsy forceps. Resection and       retrieval were complete.      A  12 mm polyp was found in the proximal ascending colon, immediately       distal to the IC valve on same side only on retroflexed view. The polyp       was lateral spreading. Preparations were made for mucosal resection.       Methylene blue was injected to raise the lesion. Snare mucosal resection       was performed in retroflexed view only as unable to identify polyp on       forward view despite lifting. Resection was incomplete, about 75% of the       polyp was removed in piecemeal. The resected tissue was retrieved. Area       was  tattooed with an injection of [place] ink.      A 6 mm polyp was found in the sigmoid colon. The polyp was sessile. The       polyp was removed with a hot snare. Resection and retrieval were       complete.      The retroflexed view of the distal rectum and anal verge was normal and       showed no anal or rectal abnormalities.      Multiple diverticula were found in the sigmoid colon. Impression:           - Ileitis, rule out Crohn's disease. Biopsied.                       - One 10 mm polyp in the ascending colon in the                        proximal ascending colon, removed with a hot snare.                        Resected and retrieved. Clips were placed.                       - One 6 mm polyp in the distal ascending colon, removed                        with a hot snare. Resected and retrieved.                       - One diminutive polyp in the ascending colon, removed                        with a cold biopsy forceps. Resected and retrieved.                       - One 12 mm polyp in the proximal ascending colon,                        removed with mucosal resection. Incomplete resection.                        Resected tissue retrieved. Tattooed.                       - One 6 mm polyp in the sigmoid colon, removed with a                        hot snare.  Resected and retrieved.                       - The distal rectum and anal verge are normal on                        retroflexion view.                       - Diverticulosis in the sigmoid colon.                       - Mucosal resection was performed. Resection was                        incomplete. The resected tissue was retrieved. Recommendation:       - Discharge patient to home.                       - Resume previous diet today.                       - Continue present medications.                       - Await pathology results.                       - Repeat colonoscopy in 3 years orsooner based on                         pathology results for surveillance of multiple polyps. Procedure Code(s):    --- Professional ---                       [phone], 52, Colonoscopy, flexible; with endoscopic                        mucosal resection                       [phone], Colonoscopy, flexible; with removal of tumor(s),                        polyp(s), or other lesion(s) by snare technique                       [id number], 23, Colonoscopy, flexible; with biopsy, single                        or multiple Diagnosis Code(s):    --- Professional ---                       K52.9, Noninfective gastroenteritis and colitis,                        unspecified                       D12.2, Benign neoplasm of ascending colon                       D12.5, Benign neoplasm of sigmoid colon  R10.31, Right lower quadrant pain                       K57.30, Diverticulosis of large intestine without                        perforation or abscess without bleeding CPT copyright 2016 American Medical Association. All rights reserved. The codes documented in this report are preliminary and upon coder review may  be revised to meet current compliance requirements. Dr. Ulyess Mort Lin Landsman MD, MD 08/04/2017 3:52:38 PM This report has been signed electronically. Number of Addenda: 0 Note Initiated On: 08/04/2017 2:09 PM Scope Withdrawal Time: 1 hour 9 minutes 42 seconds  Total Procedure Duration: 1 hour 15 minutes 56 seconds       [facility]

## 2017-08-04 NOTE — Anesthesia Preprocedure Evaluation (Signed)
Anesthesia Evaluation  Patient identified by MRN, date of birth, ID band Patient awake    Reviewed: Allergy & Precautions, NPO status , Patient's Chart, lab work & pertinent test results  History of Anesthesia Complications Negative for: history of anesthetic complications  Airway Mallampati: III  TM Distance: >3 FB Neck ROM: Full    Dental no notable dental hx.    Pulmonary neg pulmonary ROS, neg sleep apnea, COPD,    breath sounds clear to auscultation- rhonchi (-) wheezing      Cardiovascular hypertension, Pt. on medications + Peripheral Vascular Disease (thoracic aneurysm)  (-) CAD, (-) Past MI, (-) Cardiac Stents and (-) CABG  Rhythm:Regular Rate:Normal - Systolic murmurs and - Diastolic murmurs Echo 0/2/72: - Left ventricle: The cavity size was normal. Wall thickness was   increased in a pattern of mild LVH. Systolic function was normal.   The estimated ejection fraction was in the range of 55% to 60%.   Although no diagnostic regional wall motion abnormality was   identified, this possibility cannot be completely excluded on the   basis of this study. Doppler parameters are consistent with   abnormal left ventricular relaxation (grade 1 diastolic   dysfunction). - Aortic valve: There was no stenosis. There was trivial   regurgitation. - Aorta: Dilated aortic root and ascending aorta. Ascending aorta   dimension: 43 mm. Aortic root dimension: 46 mm (ED). - Mitral valve: There was no significant regurgitation. - Right ventricle: The cavity size was mildly to moderately   dilated. Systolic function was mildly reduced. - Right atrium: The atrium was mildly dilated. - Pulmonary arteries: No complete TR doppler jet so unable to   estimate PA systolic pressure. - Inferior vena cava: The vessel was normal in size. The   respirophasic diameter changes were in the normal range (= 50%),   consistent with normal central venous  pressure.   Neuro/Psych negative neurological ROS  negative psych ROS   GI/Hepatic negative GI ROS, Neg liver ROS,   Endo/Other  negative endocrine ROSneg diabetes  Renal/GU negative Renal ROS     Musculoskeletal negative musculoskeletal ROS (+)   Abdominal (+) + obese,   Peds  Hematology negative hematology ROS (+)   Anesthesia Other Findings Past Medical History: No date: Acute appendicitis 02/04/2017: DOE (dyspnea on exertion) 02/04/2017: Essential hypertension No date: Hypertension No date: Moderate obesity     Comment:  Had been better controlled while he was living in [place]. He gained 30 pounds since returning in January 2018 05/2016: Right bundle branch block (RBBB) on electrocardiogram (ECG) 06/20/2017: Ruptured appendicitis 05/2016: Thoracic aortic aneurysm without rupture ([place])     Comment:  Noted on Cardiac Cath with TAA Angio - Aortic Root 5.1               cm   Reproductive/Obstetrics                             Anesthesia Physical Anesthesia Plan  ASA: III  Anesthesia Plan: General   Post-op Pain Management:    Induction: Intravenous  PONV Risk Score and Plan: 1 and Propofol infusion  Airway Management Planned: Natural Airway  Additional Equipment:   Intra-op Plan:   Post-operative Plan:   Informed Consent: I have reviewed the patients History and Physical, chart, labs and discussed the procedure including the risks, benefits and alternatives  for the proposed anesthesia with the patient or authorized representative who has indicated his/her understanding and acceptance.   Dental advisory given  Plan Discussed with: CRNA and Anesthesiologist  Anesthesia Plan Comments:         Anesthesia Quick Evaluation

## 2017-08-04 NOTE — Anesthesia Post-op Follow-up Note (Signed)
Anesthesia QCDR form completed.        

## 2017-08-05 ENCOUNTER — Encounter: Payer: Self-pay | Admitting: Gastroenterology

## 2017-08-06 ENCOUNTER — Telehealth: Payer: Self-pay | Admitting: Gastroenterology

## 2017-08-06 NOTE — Telephone Encounter (Signed)
Results/colonoscopy

## 2017-08-07 LAB — SURGICAL PATHOLOGY

## 2017-08-07 NOTE — Telephone Encounter (Signed)
Unable to contact patient (phone has a fast busy signal) to inform him that we will call him once we get results.

## 2017-08-11 ENCOUNTER — Telehealth: Payer: Self-pay | Admitting: Gastroenterology

## 2017-08-11 NOTE — Telephone Encounter (Signed)
Pt is calling for results from procedure please call pt

## 2017-08-12 DIAGNOSIS — M75122 Complete rotator cuff tear or rupture of left shoulder, not specified as traumatic: Secondary | ICD-10-CM | POA: Diagnosis not present

## 2017-08-13 DIAGNOSIS — M25512 Pain in left shoulder: Secondary | ICD-10-CM | POA: Diagnosis not present

## 2017-08-13 DIAGNOSIS — M6281 Muscle weakness (generalized): Secondary | ICD-10-CM | POA: Diagnosis not present

## 2017-08-13 DIAGNOSIS — M25612 Stiffness of left shoulder, not elsewhere classified: Secondary | ICD-10-CM | POA: Diagnosis not present

## 2017-08-13 NOTE — Telephone Encounter (Signed)
Make appt for pt to discuss results

## 2017-08-13 NOTE — Telephone Encounter (Signed)
Attempted to call pt and spouse number to inform per Note schedule Fu for results but both numbers have busy tone

## 2017-08-17 DIAGNOSIS — M25612 Stiffness of left shoulder, not elsewhere classified: Secondary | ICD-10-CM | POA: Diagnosis not present

## 2017-08-17 DIAGNOSIS — M25512 Pain in left shoulder: Secondary | ICD-10-CM | POA: Diagnosis not present

## 2017-08-17 DIAGNOSIS — M6281 Muscle weakness (generalized): Secondary | ICD-10-CM | POA: Diagnosis not present

## 2017-08-20 DIAGNOSIS — M25512 Pain in left shoulder: Secondary | ICD-10-CM | POA: Diagnosis not present

## 2017-08-20 DIAGNOSIS — M25612 Stiffness of left shoulder, not elsewhere classified: Secondary | ICD-10-CM | POA: Diagnosis not present

## 2017-08-20 DIAGNOSIS — M6281 Muscle weakness (generalized): Secondary | ICD-10-CM | POA: Diagnosis not present

## 2017-08-25 DIAGNOSIS — M25512 Pain in left shoulder: Secondary | ICD-10-CM | POA: Diagnosis not present

## 2017-08-25 DIAGNOSIS — M25612 Stiffness of left shoulder, not elsewhere classified: Secondary | ICD-10-CM | POA: Diagnosis not present

## 2017-08-25 DIAGNOSIS — M6281 Muscle weakness (generalized): Secondary | ICD-10-CM | POA: Diagnosis not present

## 2017-08-27 DIAGNOSIS — M25612 Stiffness of left shoulder, not elsewhere classified: Secondary | ICD-10-CM | POA: Diagnosis not present

## 2017-08-27 DIAGNOSIS — M6281 Muscle weakness (generalized): Secondary | ICD-10-CM | POA: Diagnosis not present

## 2017-08-27 DIAGNOSIS — M25512 Pain in left shoulder: Secondary | ICD-10-CM | POA: Diagnosis not present

## 2017-08-31 ENCOUNTER — Encounter: Payer: Self-pay | Admitting: Gastroenterology

## 2017-08-31 ENCOUNTER — Encounter (INDEPENDENT_AMBULATORY_CARE_PROVIDER_SITE_OTHER): Payer: Self-pay

## 2017-08-31 ENCOUNTER — Ambulatory Visit: Payer: BLUE CROSS/BLUE SHIELD | Admitting: Gastroenterology

## 2017-08-31 VITALS — BP 149/87 | HR 73 | Temp 98.5°F | Ht 69.0 in | Wt 256.4 lb

## 2017-08-31 DIAGNOSIS — K50018 Crohn's disease of small intestine with other complication: Secondary | ICD-10-CM | POA: Diagnosis not present

## 2017-08-31 DIAGNOSIS — K3532 Acute appendicitis with perforation and localized peritonitis, without abscess: Secondary | ICD-10-CM

## 2017-08-31 MED ORDER — BUDESONIDE 3 MG PO CPEP: 9.0000 mg | ORAL_CAPSULE | ORAL | 2 refills | Status: DC

## 2017-08-31 NOTE — Progress Notes (Signed)
Richard Darby, MD [address]  [place]  [place], [place] [address]  Main: [phone]  Fax: [phone]    Gastroenterology Consultation  Referring Provider:     Cyndi Bender, PA-C Primary Care Physician:  Cyndi Bender, PA-C Primary Gastroenterologist:  Dr. Cephas Gallagher Reason for Consultation:     Right lower quadrant pain        HPI:   Richard Gallagher is a 62 y.o. male referred by Dr. Cyndi Bender, PA-C  for consultation & management of right lower quadrant pain, acute episode associated with fever in 06/2017 for which he was admitted to [facility], found to have ruptured appendicitis with inflammation involving the cecum, associated phlegmon, treated medically with antibiotics by general surgery.subsequently, he had a repeat CT scan which confirm resolution of the abscess. He continued to have ongoing right lower quadrant cramping pain, intermittent episodes without any diarrhea, nausea and vomiting. Therefore, he is referred to GI for further evaluation. He underwent colonoscopy on 08/04/2017 which revealed mild inflammation in the terminal ileum which was biopsied and found to have active ileitis. He had several polyps in the colon that were removed at that time, pathology showed tubular adenoma without high-grade dysplasia.  He is here to discuss about the colonoscopy results. He is accompanied by his wife. He continues to have mild right lower quadrant cramping pain. He denies weight loss, loss of appetite, fever, chills, nausea, vomiting. He does report chronic cough, worse after eating and at night. He consumes red meat, carbonated beverages, artificial sweeteners, fried foods almost daily. He used to live in [place] for 6 years, he lost about 100 pounds at that time as he was eating only homemade food.   NSAIDs: none  Antiplts/Anticoagulants/Anti thrombotics: aspirin 81  GI Procedures:  Colonoscopy 08/04/2017 - Ileitis, rule out  Crohn's disease. Biopsied. - One 10 mm polyp in the ascending colon in the proximal ascending colon, removed with a hot snare. Resected and retrieved. Clips were placed. - One 6 mm polyp in the distal ascending colon, removed with a hot snare. Resected and retrieved. - One diminutive polyp in the ascending colon, removed with a cold biopsy forceps. Resected and retrieved. - One 12 mm polyp in the proximal ascending colon, removed with mucosal resection. Incomplete resection. Resected tissue retrieved. Tattooed. - One 6 mm polyp in the sigmoid colon, removed with a hot snare. Resected and retrieved. - The distal rectum and anal verge are normal on retroflexion view. - Diverticulosis in the sigmoid colon. - Mucosal resection was performed. Resection was incomplete. The resected tissue was retrieved.  DIAGNOSIS:  A. TERMINAL ILEUM; COLD BIOPSY:  - ACTIVE ILEITIS WITH APHTHOID LESIONS, SEE COMMENT.  - NEGATIVE FOR INFECTIOUS AGENTS AND GRANULOMAS.   B. COLON POLYP, ASCENDING; HOT SNARE AND COLD BIOPSY:  - SESSILE SERRATED ADENOMA WITH CYTOLOGIC DYSPLASIA, MULTIPLE FRAGMENTS.  - NEGATIVE FOR HIGH-GRADE DYSPLASIA AND MALIGNANCY.  - COMPLETE REMOVAL IS INDICATED.   C. COLON POLYP X 2, ASCENDING; HOT SNARE AND COLD BIOPSY:  - TUBULAR ADENOMAS, 2 FRAGMENTS.  - NEGATIVE FOR HIGH-GRADE DYSPLASIA AND MALIGNANCY.   D. COLON POLYP, ASCENDING; HOT SNARE:  - TUBULAR ADENOMA, MULTIPLE FRAGMENTS.  - NEGATIVE FOR HIGH-GRADE DYSPLASIA AND MALIGNANCY.   E. COLON POLYP, DESCENDING; HOT SNARE:  - TUBULAR ADENOMA, MULTIPLE FRAGMENTS.  - NEGATIVE FOR HIGH-GRADE DYSPLASIA AND MALIGNANCY.    Past Medical History:  Diagnosis Date  . Acute appendicitis   . DOE (dyspnea on exertion)  02/04/2017  . Essential hypertension 02/04/2017  . Hypertension   . Moderate obesity    Had been better controlled while he was living in [place]. He gained 30 pounds since returning in January 2018  . Right bundle branch  block (RBBB) on electrocardiogram (ECG) 05/2016  . Ruptured appendicitis 06/20/2017  . Thoracic aortic aneurysm without rupture ([place]) 05/2016   Noted on Cardiac Cath with TAA Angio - Aortic Root 5.1 cm    Past Surgical History:  Procedure Laterality Date  . BACK SURGERY    . CARDIAC CATHETERIZATION  05/2016    ([place], [place]. Dr. Modesto Charon) 05/22/16: EF 55%. Normal coronary arteries. Dilated aortic root at 5.1 cm. 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 codominant  . COLONOSCOPY WITH PROPOFOL N/A 08/04/2017   Procedure: COLONOSCOPY WITH PROPOFOL;  Surgeon: Lin Landsman, MD;  Location: [facility] ENDOSCOPY;  Service: Gastroenterology;  Laterality: N/A;  . ROTATOR CUFF REPAIR Left      Current Outpatient Medications:  .  aspirin EC 81 MG tablet, Take 81 mg by mouth daily., Disp: , Rfl:  .  atenolol (TENORMIN) 50 MG tablet, Take 50 mg by mouth daily., Disp: , Rfl: 0 .  hydrochlorothiazide (HYDRODIURIL) 25 MG tablet, Take 25 mg by mouth daily., Disp: , Rfl: 0 .  losartan (COZAAR) 25 MG tablet, Take 25 mg by mouth daily., Disp: , Rfl: 0 .  Multiple Vitamins-Minerals (ZINC PO), Take 1 tablet by mouth daily., Disp: , Rfl:  .  Omega-3 Fatty Acids (FISH OIL) 1000 MG CAPS, Take 1,000 mg by mouth daily., Disp: , Rfl:  .  zolpidem (AMBIEN) 5 MG tablet, Take 5 mg by mouth at bedtime as needed., Disp: , Rfl: 1   Family History  Problem Relation Age of Onset  . Hypertension Mother   . Diabetes Mother   . Heart disease Mother        He is not sure of details  . Arthritis Mother   . Hypertension Father   . Diabetes Father   . Arthritis Father   . Stroke Brother      Social History   Tobacco Use  . Smoking status: Never Smoker  . Smokeless tobacco: Never Used  Substance Use Topics  . Alcohol use: Yes    Comment:  moderate use  . Drug use: No    Allergies as of 08/31/2017  . (No Known Allergies)    Review of Systems:    All systems reviewed and  negative except where noted in HPI.   Physical Exam:  BP (!) 149/87   Pulse 73   Temp 98.5 F (36.9 C) (Oral)   Ht 5' 9"  (1.753 m)   Wt 256 lb 6.4 oz (116.3 kg)   BMI 37.86 kg/m  No LMP for male patient.  General:   Alert,  Well-developed, well-nourished, pleasant and cooperative in NAD Head:  Normocephalic and atraumatic. Eyes:  Sclera clear, no icterus.   Conjunctiva pink. Ears:  Normal auditory acuity. Nose:  No deformity, discharge, or lesions. Mouth:  No deformity or lesions,oropharynx pink & moist. Neck:  Supple; no masses or thyromegaly. Lungs:  Respirations even and unlabored.  Clear throughout to auscultation.   No wheezes, crackles, or rhonchi. No acute distress. Heart:  Regular rate and rhythm; no murmurs, clicks, rubs, or gallops. Abdomen:  Normal bowel sounds. Soft, morbidly obese, non-tender and non-distended without masses, hepatosplenomegaly or hernias noted.  No guarding or rebound tenderness.   Rectal:  Not performed Msk:  Symmetrical without gross deformities. Good, equal movement & strength bilaterally. Pulses:  Normal pulses noted. Extremities:  No clubbing or edema.  No cyanosis. Neurologic:  Alert and oriented x3;  grossly normal neurologically. Skin:  Intact without significant lesions or rashes. No jaundice. Psych:  Alert and cooperative. Normal mood and affect.  Imaging Studies: reviewed  Assessment and Plan:   JOUD INGWERSEN is a 62 y.o. Caucasian male with morbid obesity, hypertension with episode of perforated appendicitis in 06/2017, managed adequately with antibiotics, ongoing mild right lower quadrant pain, colonoscopy revealed active ileitis. There is no background chronicity to confirm Crohn's disease. However, he may have early Crohn's disease which sometimes can lead to appendicitis.  Perforated appendicitis/active ileitis: - Trial of budesonide 3 mg 3 pills daily for 3 months - Highly encouraged him to avoid carbonated beverages, deficiency  sweeteners and cut back on consuming red meat - He is seeing Dr. Burt Knack in one to 2 weeks. Patient will benefit from watchful waiting, to see if he responds to budesonide rather than undergoing surgery unless his symptoms get worse as Crohn's disease is a possible diagnosis  History of tubular adenomas in colon: A 12 mm polyp in ascending colon was incompletely resected. Pathology of the polyp came back as tubular adenoma with no evidence of high-grade dysplasia. This site was tattooed Discussed with him about repeating colonoscopy in next 6-12 months to remove the residual polyp   Follow up in 3 months   Richard Darby, MD

## 2017-09-01 DIAGNOSIS — M25512 Pain in left shoulder: Secondary | ICD-10-CM | POA: Diagnosis not present

## 2017-09-01 DIAGNOSIS — M25612 Stiffness of left shoulder, not elsewhere classified: Secondary | ICD-10-CM | POA: Diagnosis not present

## 2017-09-01 DIAGNOSIS — M6281 Muscle weakness (generalized): Secondary | ICD-10-CM | POA: Diagnosis not present

## 2017-09-03 DIAGNOSIS — M6281 Muscle weakness (generalized): Secondary | ICD-10-CM | POA: Diagnosis not present

## 2017-09-03 DIAGNOSIS — M25512 Pain in left shoulder: Secondary | ICD-10-CM | POA: Diagnosis not present

## 2017-09-03 DIAGNOSIS — M25612 Stiffness of left shoulder, not elsewhere classified: Secondary | ICD-10-CM | POA: Diagnosis not present

## 2017-09-07 DIAGNOSIS — M25612 Stiffness of left shoulder, not elsewhere classified: Secondary | ICD-10-CM | POA: Diagnosis not present

## 2017-09-07 DIAGNOSIS — M25512 Pain in left shoulder: Secondary | ICD-10-CM | POA: Diagnosis not present

## 2017-09-07 DIAGNOSIS — M6281 Muscle weakness (generalized): Secondary | ICD-10-CM | POA: Diagnosis not present

## 2017-09-08 DIAGNOSIS — Z6838 Body mass index (BMI) 38.0-38.9, adult: Secondary | ICD-10-CM | POA: Diagnosis not present

## 2017-09-08 DIAGNOSIS — G4700 Insomnia, unspecified: Secondary | ICD-10-CM | POA: Diagnosis not present

## 2017-09-08 DIAGNOSIS — Z1331 Encounter for screening for depression: Secondary | ICD-10-CM | POA: Diagnosis not present

## 2017-09-08 DIAGNOSIS — I10 Essential (primary) hypertension: Secondary | ICD-10-CM | POA: Diagnosis not present

## 2017-09-09 ENCOUNTER — Encounter: Payer: Self-pay | Admitting: Surgery

## 2017-09-09 ENCOUNTER — Ambulatory Visit (INDEPENDENT_AMBULATORY_CARE_PROVIDER_SITE_OTHER): Payer: BLUE CROSS/BLUE SHIELD | Admitting: Surgery

## 2017-09-09 VITALS — BP 159/95 | HR 91 | Temp 97.8°F | Wt 251.0 lb

## 2017-09-09 DIAGNOSIS — K3580 Unspecified acute appendicitis: Secondary | ICD-10-CM

## 2017-09-09 NOTE — Patient Instructions (Signed)
I will be calling you with an appointment in 3 months to discuss possible surgery.

## 2017-09-09 NOTE — Progress Notes (Signed)
Outpatient Surgical Follow Up  09/09/2017  Richard Gallagher is an 62 y.o. male.   CC: History of ruptured appendicitis  HPI: This patient with a history of ruptured appendicitis.  He was treated with IV antibiotics.  He has had a recent colonoscopy.  Patient has also been diagnosed with Crohn's disease based on biopsy showing ileitis.  He has been starting on an anti-Crohn's medication by Dr. Marius Ditch he currently only has minimal pain in the right lower quadrant no fevers or chills.  Past Medical History:  Diagnosis Date  . Acute appendicitis   . DOE (dyspnea on exertion) 02/04/2017  . Essential hypertension 02/04/2017  . Hypertension   . Moderate obesity    Had been better controlled while he was living in [place]. He gained 30 pounds since returning in January 2018  . Right bundle branch block (RBBB) on electrocardiogram (ECG) 05/2016  . Ruptured appendicitis 06/20/2017  . Thoracic aortic aneurysm without rupture ([place]) 05/2016   Noted on Cardiac Cath with TAA Angio - Aortic Root 5.1 cm    Past Surgical History:  Procedure Laterality Date  . BACK SURGERY    . CARDIAC CATHETERIZATION  05/2016    ([place], [place]. Dr. Modesto Charon) 05/22/16: EF 55%. Normal coronary arteries. Dilated aortic root at 5.1 cm. 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 codominant  . COLONOSCOPY WITH PROPOFOL N/A 08/04/2017   Procedure: COLONOSCOPY WITH PROPOFOL;  Surgeon: Lin Landsman, MD;  Location: [facility] ENDOSCOPY;  Service: Gastroenterology;  Laterality: N/A;  . ROTATOR CUFF REPAIR Left     Family History  Problem Relation Age of Onset  . Hypertension Mother   . Diabetes Mother   . Heart disease Mother        He is not sure of details  . Arthritis Mother   . Hypertension Father   . Diabetes Father   . Arthritis Father   . Stroke Brother     Social History:  reports that he has never smoked. He has never used smokeless tobacco. He reports that he drinks  alcohol. He reports that he does not use drugs.  Allergies: No Known Allergies  Medications reviewed.   Review of Systems:   Review of Systems  Constitutional: Negative.   HENT: Negative.   Eyes: Negative.   Respiratory: Negative.   Cardiovascular: Negative.   Gastrointestinal: Positive for abdominal pain. Negative for nausea and vomiting.  Genitourinary: Negative.   Musculoskeletal: Negative.   Skin: Negative.   Neurological: Negative.   Endo/Heme/Allergies: Negative.   Psychiatric/Behavioral: Negative.      Physical Exam:  There were no vitals taken for this visit.  Physical Exam  Constitutional: He is oriented to person, place, and time and well-developed, well-nourished, and in no distress. No distress.  Morbidly obese  HENT:  Head: Normocephalic and atraumatic.  Eyes: Pupils are equal, round, and reactive to light. Right eye exhibits no discharge. Left eye exhibits no discharge. No scleral icterus.  Neck: Normal range of motion. No JVD present.  Cardiovascular: Normal rate, regular rhythm and normal heart sounds.  Pulmonary/Chest: Effort normal and breath sounds normal. No respiratory distress. He has no wheezes.  Abdominal: Soft. He exhibits no distension. There is no tenderness. There is no rebound and no guarding.  obese  Musculoskeletal: Normal range of motion. He exhibits no edema or tenderness.  Lymphadenopathy:    He has no cervical adenopathy.  Neurological: He is alert and oriented to person, place, and time.  Skin: Skin is warm and dry. No rash noted. He is not diaphoretic. No erythema.  Psychiatric: Mood and affect normal.  Vitals reviewed.     No results found for this or any previous visit (from the past 48 hour(s)). No results found.  Assessment/Plan:  Colonoscopy results reviewed.  I agree that as this patient has been recently diagnosed with Crohn's disease that removal of his appendix currently can be delayed.  I do believe he is at high  risk for recurrent rupture or recurrent appendicitis in the future and warrants laparoscopic attempt at appendectomy.  However as he is just been started on Crohn's medication I agree that waiting 3 months and reevaluating the patient including a CT scan is warranted prior to scheduling any sort of surgery.  My believe is that this patient will warrant appendectomy in the future but waiting until his Crohn's disease is settled out including proper treatment regimen is also warranted.  Patient has an appointment with Dr. Marius Ditch in June and I will see him back after that.  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  Florene Glen, MD, FACS

## 2017-09-10 DIAGNOSIS — M6281 Muscle weakness (generalized): Secondary | ICD-10-CM | POA: Diagnosis not present

## 2017-09-10 DIAGNOSIS — M25612 Stiffness of left shoulder, not elsewhere classified: Secondary | ICD-10-CM | POA: Diagnosis not present

## 2017-09-10 DIAGNOSIS — M25512 Pain in left shoulder: Secondary | ICD-10-CM | POA: Diagnosis not present

## 2017-09-15 DIAGNOSIS — M25512 Pain in left shoulder: Secondary | ICD-10-CM | POA: Diagnosis not present

## 2017-09-15 DIAGNOSIS — M6281 Muscle weakness (generalized): Secondary | ICD-10-CM | POA: Diagnosis not present

## 2017-09-15 DIAGNOSIS — M25612 Stiffness of left shoulder, not elsewhere classified: Secondary | ICD-10-CM | POA: Diagnosis not present

## 2017-09-16 DIAGNOSIS — M75122 Complete rotator cuff tear or rupture of left shoulder, not specified as traumatic: Secondary | ICD-10-CM | POA: Diagnosis not present

## 2017-10-14 DIAGNOSIS — M75122 Complete rotator cuff tear or rupture of left shoulder, not specified as traumatic: Secondary | ICD-10-CM | POA: Diagnosis not present

## 2017-11-25 DIAGNOSIS — M75122 Complete rotator cuff tear or rupture of left shoulder, not specified as traumatic: Secondary | ICD-10-CM | POA: Diagnosis not present

## 2017-11-30 ENCOUNTER — Other Ambulatory Visit: Payer: Self-pay

## 2017-11-30 ENCOUNTER — Other Ambulatory Visit
Admission: RE | Admit: 2017-11-30 | Discharge: 2017-11-30 | Disposition: A | Discharge status: Home / Self Care | Payer: BLUE CROSS/BLUE SHIELD | Source: Ambulatory Visit | Attending: Gastroenterology | Admitting: Gastroenterology

## 2017-11-30 ENCOUNTER — Telehealth: Payer: Self-pay

## 2017-11-30 ENCOUNTER — Encounter: Payer: Self-pay | Admitting: Gastroenterology

## 2017-11-30 ENCOUNTER — Encounter (INDEPENDENT_AMBULATORY_CARE_PROVIDER_SITE_OTHER): Payer: Self-pay

## 2017-11-30 ENCOUNTER — Ambulatory Visit: Payer: BLUE CROSS/BLUE SHIELD | Admitting: Gastroenterology

## 2017-11-30 VITALS — BP 112/71 | HR 62 | Resp 17 | Ht 69.0 in | Wt 256.8 lb

## 2017-11-30 DIAGNOSIS — K635 Polyp of colon: Secondary | ICD-10-CM

## 2017-11-30 DIAGNOSIS — G8929 Other chronic pain: Secondary | ICD-10-CM | POA: Diagnosis not present

## 2017-11-30 DIAGNOSIS — Z01812 Encounter for preprocedural laboratory examination: Secondary | ICD-10-CM | POA: Insufficient documentation

## 2017-11-30 DIAGNOSIS — R140 Abdominal distension (gaseous): Secondary | ICD-10-CM

## 2017-11-30 DIAGNOSIS — D126 Benign neoplasm of colon, unspecified: Secondary | ICD-10-CM

## 2017-11-30 DIAGNOSIS — R1031 Right lower quadrant pain: Secondary | ICD-10-CM

## 2017-11-30 LAB — CREATININE, SERUM
Creatinine, Ser: 0.7 mg/dL (ref 0.61–1.24)
GFR calc Af Amer: >60 mL/min (ref >60)
GFR calc non Af Amer: >60 mL/min (ref >60)

## 2017-11-30 NOTE — Telephone Encounter (Signed)
-----   Message from [place], [place] sent at 09/09/2017 11:23 AM EDT ----- Regarding: appt Make sure to call the patient with an apointment for Acute Appendicitis after 11/30/2017 with Dr. Burt Knack.

## 2017-11-30 NOTE — Telephone Encounter (Signed)
Tried to contact patient but there was no dial tone. I will send him a letter to remind him that he needs to go back to see Dr. Marius Ditch and then he could follow up with Dr. Burt Knack to discuss possible surgery.

## 2017-11-30 NOTE — Progress Notes (Signed)
Richard Darby, MD [address]  [place]  [place], [place] [address]  Main: [phone]  Fax: [phone]    Gastroenterology Consultation  Referring Provider:     Cyndi Bender, PA-C Primary Care Physician:  Cyndi Bender, PA-C Primary Gastroenterologist:  Dr. Cephas Gallagher Reason for Consultation:     Right lower quadrant pain        HPI:   Richard Gallagher is a 62 y.o. male referred by Dr. Cyndi Bender, PA-C  for consultation & management of right lower quadrant pain, acute episode associated with fever in 06/2017 for which he was admitted to [facility], found to have ruptured appendicitis with inflammation involving the cecum, associated phlegmon, treated medically with antibiotics by general surgery.subsequently, he had a repeat CT scan which confirm resolution of the abscess. He continued to have ongoing right lower quadrant cramping pain, intermittent episodes without any diarrhea, nausea and vomiting. Therefore, he is referred to GI for further evaluation. He underwent colonoscopy on 08/04/2017 which revealed mild inflammation in the terminal ileum which was biopsied and found to have active ileitis. He had several polyps in the colon that were removed at that time, pathology showed tubular adenoma without high-grade dysplasia.  He is here to discuss about the colonoscopy results. He is accompanied by his wife. He continues to have mild right lower quadrant cramping pain. He denies weight loss, loss of appetite, fever, chills, nausea, vomiting. He does report chronic cough, worse after eating and at night. He consumes red meat, carbonated beverages, artificial sweeteners, fried foods almost daily. He used to live in [place] for 6 years, he lost about 100 pounds at that time as he was eating only homemade food.   Follow-up visit 11/30/2017 I have empirically started him on budesonide 3 mg 3 pills daily for his chronic right lower quadrant pain  and mild ileitis in the terminal ileum. This has not provided any relief. Rather, He reports feeling bloated and associated with passing flatus whenever he uses the bathroom although he has formed bowel movements which are nonbloody. The symptoms have started ever since he had a colonoscopy. He also reports feeling nauseous. He also reports feeling somnolent throughout the day. He has history of sleep apnea and is not using CPAP. He recently saw Dr. Burt Knack who is recommending appendectomy given high risk for recurrence of appendicitis.   NSAIDs: none  Antiplts/Anticoagulants/Anti thrombotics: aspirin 81  GI Procedures:  Colonoscopy 08/04/2017 - Ileitis, rule out Crohn's disease. Biopsied. - One 10 mm polyp in the ascending colon in the proximal ascending colon, removed with a hot snare. Resected and retrieved. Clips were placed. - One 6 mm polyp in the distal ascending colon, removed with a hot snare. Resected and retrieved. - One diminutive polyp in the ascending colon, removed with a cold biopsy forceps. Resected and retrieved. - One 12 mm polyp in the proximal ascending colon, removed with mucosal resection. Incomplete resection. Resected tissue retrieved. Tattooed. - One 6 mm polyp in the sigmoid colon, removed with a hot snare. Resected and retrieved. - The distal rectum and anal verge are normal on retroflexion view. - Diverticulosis in the sigmoid colon. - Mucosal resection was performed. Resection was incomplete. The resected tissue was retrieved.  DIAGNOSIS:  A. TERMINAL ILEUM; COLD BIOPSY:  - ACTIVE ILEITIS WITH APHTHOID LESIONS, SEE COMMENT.  - NEGATIVE FOR INFECTIOUS AGENTS AND GRANULOMAS.   B. COLON POLYP, ASCENDING; HOT SNARE AND COLD BIOPSY:  - SESSILE SERRATED  ADENOMA WITH CYTOLOGIC DYSPLASIA, MULTIPLE FRAGMENTS.  - NEGATIVE FOR HIGH-GRADE DYSPLASIA AND MALIGNANCY.  - COMPLETE REMOVAL IS INDICATED.   C. COLON POLYP X 2, ASCENDING; HOT SNARE AND COLD BIOPSY:  - TUBULAR  ADENOMAS, 2 FRAGMENTS.  - NEGATIVE FOR HIGH-GRADE DYSPLASIA AND MALIGNANCY.   D. COLON POLYP, ASCENDING; HOT SNARE:  - TUBULAR ADENOMA, MULTIPLE FRAGMENTS.  - NEGATIVE FOR HIGH-GRADE DYSPLASIA AND MALIGNANCY.   E. COLON POLYP, DESCENDING; HOT SNARE:  - TUBULAR ADENOMA, MULTIPLE FRAGMENTS.  - NEGATIVE FOR HIGH-GRADE DYSPLASIA AND MALIGNANCY.    Past Medical History:  Diagnosis Date  . Acute appendicitis   . DOE (dyspnea on exertion) 02/04/2017  . Essential hypertension 02/04/2017  . Hypertension   . Moderate obesity    Had been better controlled while he was living in [place]. He gained 30 pounds since returning in January 2018  . Right bundle branch block (RBBB) on electrocardiogram (ECG) 05/2016  . Ruptured appendicitis 06/20/2017  . Thoracic aortic aneurysm without rupture ([place]) 05/2016   Noted on Cardiac Cath with TAA Angio - Aortic Root 5.1 cm    Past Surgical History:  Procedure Laterality Date  . BACK SURGERY    . CARDIAC CATHETERIZATION  05/2016    ([place], [place]. Dr. Modesto Charon) 05/22/16: EF 55%. Normal coronary arteries. Dilated aortic root at 5.1 cm. 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 codominant  . COLONOSCOPY WITH PROPOFOL N/A 08/04/2017   Procedure: COLONOSCOPY WITH PROPOFOL;  Surgeon: Lin Landsman, MD;  [facility] ENDOSCOPY;  Service: Gastroenterology;  Laterality: N/A;  . ROTATOR CUFF REPAIR Left      Current Outpatient Medications:  .  aspirin EC 81 MG tablet, Take 81 mg by mouth daily., Disp: , Rfl:  .  atenolol (TENORMIN) 50 MG tablet, Take 50 mg by mouth daily., Disp: , Rfl: 0 .  budesonide (ENTOCORT EC) 3 MG 24 hr capsule, Take 3 capsules (9 mg total) by mouth every morning., Disp: 90 capsule, Rfl: 2 .  hydrochlorothiazide (HYDRODIURIL) 25 MG tablet, Take 25 mg by mouth daily., Disp: , Rfl: 0 .  losartan (COZAAR) 25 MG tablet, Take 25 mg by mouth daily., Disp: , Rfl: 0 .  Multiple Vitamins-Minerals (ZINC  PO), Take 1 tablet by mouth daily., Disp: , Rfl:  .  Omega-3 Fatty Acids (FISH OIL) 1000 MG CAPS, Take 1,000 mg by mouth daily., Disp: , Rfl:  .  zolpidem (AMBIEN) 5 MG tablet, Take 5 mg by mouth at bedtime as needed., Disp: , Rfl: 1   Family History  Problem Relation Age of Onset  . Hypertension Mother   . Diabetes Mother   . Heart disease Mother        He is not sure of details  . Arthritis Mother   . Hypertension Father   . Diabetes Father   . Arthritis Father   . Stroke Brother      Social History   Tobacco Use  . Smoking status: Never Smoker  . Smokeless tobacco: Never Used  Substance Use Topics  . Alcohol use: Yes    Comment:  moderate use  . Drug use: No    Allergies as of 11/30/2017  . (No Known Allergies)    Review of Systems:    All systems reviewed and negative except where noted in HPI.   Physical Exam:  BP 112/71 (BP Location: Left Arm, Patient Position: Sitting, Cuff Size: Large)   Pulse 62   Resp 17  Ht 5\' 9"  (1.753 m)   Wt 256 lb 12.8 oz (116.5 kg)   BMI 37.92 kg/m  No LMP for male patient.  General:   Alert,  Well-developed, well-nourished, pleasant and cooperative in NAD Head:  Normocephalic and atraumatic. Eyes:  Sclera clear, no icterus.   Conjunctiva pink. Ears:  Normal auditory acuity. Nose:  No deformity, discharge, or lesions. Mouth:  No deformity or lesions,oropharynx pink & moist. Neck:  Supple; no masses or thyromegaly. Lungs:  Respirations even and unlabored.  Clear throughout to auscultation.   No wheezes, crackles, or rhonchi. No acute distress. Heart:  Regular rate and rhythm; no murmurs, clicks, rubs, or gallops. Abdomen:  Normal bowel sounds. Soft, morbidly obese, mild upper abdominal and right lower quadrant tenderness and diffusely distended without masses, hepatosplenomegaly or hernias noted.  No guarding or rebound tenderness.   Rectal: Not performed Msk:  Symmetrical without gross deformities. Good, equal movement &  strength bilaterally. Pulses:  Normal pulses noted. Extremities:  No clubbing or edema.  No cyanosis. Neurologic:  Alert and oriented x3;  grossly normal neurologically. Skin:  Intact without significant lesions or rashes. No jaundice. Psych:  Alert and cooperative. Normal mood and affect.  Imaging Studies: reviewed  Assessment and Plan:   Richard Gallagher is a 62 y.o. Caucasian male with morbid obesity, hypertension with episode of perforated appendicitis in 06/2017, managed adequately with antibiotics, ongoing mild right lower quadrant pain, colonoscopy revealed active ileitis. There is no background chronicity to confirm Crohn's disease. However, he may have early Crohn's disease which sometimes can lead to appendicitis.  Chronic abdominal pain associated with abdominal bloating and fecal urgency History of Perforated appendicitis/active ileitis: - Trial of budesonide 3 mg 3 pills daily did not provide any benefit, stop budesonide - Other differentials include small intestinal bacterial overgrowth, will give empiric trial of Cipro and Flagyl after ruling out H. Pylori infection as patient is also complaining of nausea associated with upper abdominal pain and bloating - Performed H. Pylori breath test - recommend CT A/P with contrast. Patient is worried that he may not be able to a forward multiple CT scans as Dr. Burt Knack is also planning to do another one prior to surgery. I told him that I will communicate with Dr. Burt Knack as I'm getting the CT scan now, and he may not need another one. - Follow-up with Dr. Burt Knack for appendectomy or right hemicolectomy if I'm not successful in removing the residual polyp in ascending colon  Colon adenomas A 10 mm polyp in ascending colon was incompletely resected. Pathology of the polyp came back as sessile serrated adenoma with no evidence of high-grade dysplasia, however, it showed cytologic dysplasia. This site was tattooed Discussed with him about  repeating colonoscopy in next 4 weeks for the removal of residual polyp. If, I'm not successful in removing this polyp I'll discuss with Dr. Burt Knack in about right hemicolectomy instead of appendectomy   Follow up in 3 months   Richard Darby, MD

## 2017-12-01 LAB — H. PYLORI BREATH TEST: H. pylori UBiT: NEGATIVE

## 2017-12-07 NOTE — Telephone Encounter (Signed)
Patient called Dr. Verlin Grills office and scheduled his colonoscopy with Dr. Marius Ditch on 01/11/2018. Then he will see Dr. Burt Knack on 01/14/2018. Appointment reminder will be mailed.

## 2017-12-08 ENCOUNTER — Telehealth: Payer: Self-pay

## 2017-12-15 NOTE — Telephone Encounter (Signed)
eror

## 2017-12-23 DIAGNOSIS — M75122 Complete rotator cuff tear or rupture of left shoulder, not specified as traumatic: Secondary | ICD-10-CM | POA: Diagnosis not present

## 2018-01-04 ENCOUNTER — Ambulatory Visit
Admission: RE | Admit: 2018-01-04 | Discharge: 2018-01-04 | Disposition: A | Discharge status: Home / Self Care | Payer: BLUE CROSS/BLUE SHIELD | Source: Ambulatory Visit | Attending: Gastroenterology | Admitting: Gastroenterology

## 2018-01-04 DIAGNOSIS — I700 Atherosclerosis of aorta: Secondary | ICD-10-CM | POA: Insufficient documentation

## 2018-01-04 DIAGNOSIS — K760 Fatty (change of) liver, not elsewhere classified: Secondary | ICD-10-CM | POA: Diagnosis not present

## 2018-01-04 DIAGNOSIS — R140 Abdominal distension (gaseous): Secondary | ICD-10-CM | POA: Diagnosis not present

## 2018-01-04 DIAGNOSIS — K5730 Diverticulosis of large intestine without perforation or abscess without bleeding: Secondary | ICD-10-CM | POA: Insufficient documentation

## 2018-01-04 HISTORY — DX: Systemic involvement of connective tissue, unspecified: M35.9

## 2018-01-04 MED ORDER — IOPAMIDOL (ISOVUE-300) INJECTION 61%
100.0000 mL | Freq: Once | INTRAVENOUS | Status: AC | PRN
  Administered 2018-01-04: 100 mL via INTRAVENOUS

## 2018-01-11 ENCOUNTER — Encounter
Admission: RE | Disposition: A | Discharge status: Home / Self Care | Payer: Self-pay | Source: Ambulatory Visit | Attending: Gastroenterology

## 2018-01-11 ENCOUNTER — Ambulatory Visit: Payer: BLUE CROSS/BLUE SHIELD | Admitting: Anesthesiology

## 2018-01-11 ENCOUNTER — Ambulatory Visit
Admission: RE | Admit: 2018-01-11 | Discharge: 2018-01-11 | Disposition: A | Discharge status: Home / Self Care | Payer: BLUE CROSS/BLUE SHIELD | Source: Ambulatory Visit | Attending: Gastroenterology | Admitting: Gastroenterology

## 2018-01-11 DIAGNOSIS — I10 Essential (primary) hypertension: Secondary | ICD-10-CM | POA: Insufficient documentation

## 2018-01-11 DIAGNOSIS — Z7982 Long term (current) use of aspirin: Secondary | ICD-10-CM | POA: Insufficient documentation

## 2018-01-11 DIAGNOSIS — I739 Peripheral vascular disease, unspecified: Secondary | ICD-10-CM | POA: Diagnosis not present

## 2018-01-11 DIAGNOSIS — Z86010 Personal history of colonic polyps: Secondary | ICD-10-CM | POA: Diagnosis not present

## 2018-01-11 DIAGNOSIS — D126 Benign neoplasm of colon, unspecified: Secondary | ICD-10-CM

## 2018-01-11 DIAGNOSIS — Z79899 Other long term (current) drug therapy: Secondary | ICD-10-CM | POA: Insufficient documentation

## 2018-01-11 DIAGNOSIS — I712 Thoracic aortic aneurysm, without rupture: Secondary | ICD-10-CM | POA: Insufficient documentation

## 2018-01-11 DIAGNOSIS — D120 Benign neoplasm of cecum: Secondary | ICD-10-CM | POA: Diagnosis not present

## 2018-01-11 DIAGNOSIS — K621 Rectal polyp: Secondary | ICD-10-CM | POA: Diagnosis not present

## 2018-01-11 DIAGNOSIS — D122 Benign neoplasm of ascending colon: Secondary | ICD-10-CM | POA: Insufficient documentation

## 2018-01-11 DIAGNOSIS — K635 Polyp of colon: Secondary | ICD-10-CM | POA: Diagnosis not present

## 2018-01-11 HISTORY — PX: COLONOSCOPY WITH PROPOFOL: SHX5780

## 2018-01-11 SURGERY — COLONOSCOPY WITH PROPOFOL
Anesthesia: General

## 2018-01-11 MED ORDER — LIDOCAINE HCL (PF) 2 % IJ SOLN
INTRAMUSCULAR | Status: AC
  Filled 2018-01-11: qty 10

## 2018-01-11 MED ORDER — PROPOFOL 10 MG/ML IV BOLUS
INTRAVENOUS | Status: DC | PRN
  Administered 2018-01-11: 20 mg via INTRAVENOUS
  Administered 2018-01-11: 70 mg via INTRAVENOUS

## 2018-01-11 MED ORDER — PROPOFOL 500 MG/50ML IV EMUL
INTRAVENOUS | Status: AC
  Filled 2018-01-11: qty 100

## 2018-01-11 MED ORDER — SUCCINYLCHOLINE CHLORIDE 20 MG/ML IJ SOLN
INTRAMUSCULAR | Status: AC
  Filled 2018-01-11: qty 1

## 2018-01-11 MED ORDER — PROPOFOL 500 MG/50ML IV EMUL
INTRAVENOUS | Status: DC | PRN
Start: 2018-01-11 — End: 2018-01-11
  Administered 2018-01-11: 150 ug/kg/min via INTRAVENOUS

## 2018-01-11 MED ORDER — SODIUM CHLORIDE 0.9 % IV SOLN
INTRAVENOUS | Status: DC
  Administered 2018-01-11: 1000 mL via INTRAVENOUS

## 2018-01-11 MED ORDER — PHENYLEPHRINE HCL 10 MG/ML IJ SOLN
INTRAMUSCULAR | Status: AC
  Filled 2018-01-11: qty 1

## 2018-01-11 MED ORDER — GLYCOPYRROLATE 0.2 MG/ML IJ SOLN
INTRAMUSCULAR | Status: AC
  Filled 2018-01-11: qty 2

## 2018-01-11 MED ORDER — PROPOFOL 10 MG/ML IV BOLUS
INTRAVENOUS | Status: AC
  Filled 2018-01-11: qty 20

## 2018-01-11 MED ORDER — SODIUM CHLORIDE 0.9 % IJ SOLN
INTRAMUSCULAR | Status: AC
  Filled 2018-01-11: qty 10

## 2018-01-11 MED ORDER — EPHEDRINE SULFATE 50 MG/ML IJ SOLN
INTRAMUSCULAR | Status: AC
  Filled 2018-01-11: qty 1

## 2018-01-11 NOTE — Transfer of Care (Signed)
Immediate Anesthesia Transfer of Care Note  Patient: Richard Gallagher  Procedure(s) Performed: COLONOSCOPY WITH PROPOFOL (N/A )  Patient Location: PACU  Anesthesia Type:General  Level of Consciousness: sedated  Airway & Oxygen Therapy: Patient Spontanous Breathing and Patient connected to nasal cannula oxygen  Post-op Assessment: Report given to RN and Post -op Vital signs reviewed and stable  Post vital signs: Reviewed and stable  Last Vitals:  Vitals Value Taken Time  BP 106/80 01/11/2018  9:04 AM  Temp 36.2 C 01/11/2018  9:04 AM  Pulse 69 01/11/2018  9:04 AM  Resp 21 01/11/2018  9:04 AM  SpO2 92 % 01/11/2018  9:04 AM    Last Pain:  Vitals:   01/11/18 0903  TempSrc: Tympanic  PainSc: 0-No pain         Complications: No apparent anesthesia complications

## 2018-01-11 NOTE — Anesthesia Preprocedure Evaluation (Signed)
Anesthesia Evaluation  Patient identified by MRN, date of birth, ID band Patient awake    Reviewed: Allergy & Precautions, H&P , NPO status , Patient's Chart, lab work & pertinent test results, reviewed documented beta blocker date and time   Airway Mallampati: II   Neck ROM: full    Dental  (+) Poor Dentition, Teeth Intact   Pulmonary neg pulmonary ROS,    Pulmonary exam normal        Cardiovascular Exercise Tolerance: Good hypertension, On Medications + Peripheral Vascular Disease and + DOE  negative cardio ROS Normal cardiovascular exam Rhythm:regular Rate:Normal     Neuro/Psych negative neurological ROS  negative psych ROS   GI/Hepatic negative GI ROS, Neg liver ROS,   Endo/Other  negative endocrine ROS  Renal/GU negative Renal ROS  negative genitourinary   Musculoskeletal   Abdominal   Peds  Hematology negative hematology ROS (+)   Anesthesia Other Findings Past Medical History: No date: Acute appendicitis No date: Collagen vascular disease ([place]) 02/04/2017: DOE (dyspnea on exertion) 02/04/2017: Essential hypertension No date: Hypertension No date: Moderate obesity     Comment:  Had been better controlled while he was living in [place]. He gained 30 pounds since returning in January 2018 05/2016: Right bundle branch block (RBBB) on electrocardiogram (ECG) 06/20/2017: Ruptured appendicitis 05/2016: Thoracic aortic aneurysm without rupture ([place])     Comment:  Noted on Cardiac Cath with TAA Angio - Aortic Root 5.1               cm Past Surgical History: No date: BACK SURGERY 05/2016: CARDIAC CATHETERIZATION     Comment:   [facility] [facility], [place]. Dr. Modesto Charon)               05/22/16: EF 55%. Normal coronary arteries. Dilated               aortic root at 5.1 cm. Dual ostia for left coronary               arteries (LAD and LCx). codominant 08/04/2017: COLONOSCOPY WITH  PROPOFOL; N/A     Comment:  Procedure: COLONOSCOPY WITH PROPOFOL;  Surgeon: Lin Landsman, MD;  Location: [facility] ENDOSCOPY;  Service:               Gastroenterology;  Laterality: N/A; No date: ROTATOR CUFF REPAIR; Left BMI    Body Mass Index:  35.87 kg/m     Reproductive/Obstetrics negative OB ROS                             Anesthesia Physical Anesthesia Plan  ASA: III  Anesthesia Plan: General   Post-op Pain Management:    Induction:   PONV Risk Score and Plan:   Airway Management Planned:   Additional Equipment:   Intra-op Plan:   Post-operative Plan:   Informed Consent: I have reviewed the patients History and Physical, chart, labs and discussed the procedure including the risks, benefits and alternatives for the proposed anesthesia with the patient or authorized representative who has indicated his/her understanding and acceptance.   Dental Advisory Given  Plan Discussed with: CRNA  Anesthesia Plan Comments:         Anesthesia Quick Evaluation

## 2018-01-11 NOTE — Anesthesia Postprocedure Evaluation (Signed)
Anesthesia Post Note  Patient: Richard Gallagher  Procedure(s) Performed: COLONOSCOPY WITH PROPOFOL (N/A )  Patient location during evaluation: PACU Anesthesia Type: General Level of consciousness: awake and alert Pain management: pain level controlled Vital Signs Assessment: post-procedure vital signs reviewed and stable Respiratory status: spontaneous breathing, nonlabored ventilation, respiratory function stable and patient connected to nasal cannula oxygen Cardiovascular status: blood pressure returned to baseline and stable Postop Assessment: no apparent nausea or vomiting Anesthetic complications: no     Last Vitals:  Vitals:   01/11/18 0903 01/11/18 0904  BP: 106/80 106/80  Pulse:  69  Resp: 20 (!) 21  Temp: (!) 36.2 C (!) 36.2 C  SpO2: 92% 92%    Last Pain:  Vitals:   01/11/18 0903  TempSrc: Tympanic  PainSc: 0-No pain                 Molli Barrows

## 2018-01-11 NOTE — Op Note (Signed)
[facility] Gastroenterology Patient Name: Richard Gallagher Procedure Date: 01/11/2018 8:11 AM MRN: [id number] Account #: [id number] Date of Birth: 09-01-1955 Admit Type: Outpatient Age: 62 Room: [facility] ENDO ROOM 2 Gender: Male Note Status: Finalized Procedure:            Colonoscopy Indications:          Surveillance: Incomplete removal of large sessile                        adenoma last colonoscopy (<3 yrs), Last colonoscopy:                        February 2019 Providers:            Lin Landsman MD, MD Referring MD:         Cyndi Bender (Referring MD) Medicines:            Monitored Anesthesia Care Complications:        No immediate complications. Estimated blood loss: None. Procedure:            Pre-Anesthesia Assessment:                       - Prior to the procedure, a History and Physical was                        performed, and patient medications and allergies were                        reviewed. The patient is competent. The risks and                        benefits of the procedure and the sedation options and                        risks were discussed with the patient. All questions                        were answered and informed consent was obtained.                        Patient identification and proposed procedure were                        verified by the physician, the nurse, the                        anesthesiologist, the anesthetist and the technician in                        the pre-procedure area in the procedure room in the                        endoscopy suite. Mental Status Examination: alert and                        oriented. Airway Examination: normal oropharyngeal                        airway and neck mobility. Respiratory Examination:  clear to auscultation. CV Examination: normal.                        Prophylactic Antibiotics: The patient does not require                        prophylactic  antibiotics. Prior Anticoagulants: The                        patient has taken no previous anticoagulant or                        antiplatelet agents. ASA Grade Assessment: III - A                        patient with severe systemic disease. After reviewing                        the risks and benefits, the patient was deemed in                        satisfactory condition to undergo the procedure. The                        anesthesia plan was to use monitored anesthesia care                        (MAC). Immediately prior to administration of                        medications, the patient was re-assessed for adequacy                        to receive sedatives. The heart rate, respiratory rate,                        oxygen saturations, blood pressure, adequacy of                        pulmonary ventilation, and response to care were                        monitored throughout the procedure. The physical status                        of the patient was re-assessed after the procedure.                       After obtaining informed consent, the colonoscope was                        passed under direct vision. Throughout the procedure,                        the patient's blood pressure, pulse, and oxygen                        saturations were monitored continuously. The                        Colonoscope  was introduced through the anus and                        advanced to the the terminal ileum. The colonoscopy was                        unusually difficult due to significant looping and the                        patient's body habitus. The patient tolerated the                        procedure well. The quality of the bowel preparation                        was evaluated using the BBPS [facility] Bowel Preparation                        Scale) with scores of: Right Colon = 3, Transverse                        Colon = 3 and Left Colon = 3 (entire mucosa seen well                         with no residual staining, small fragments of stool or                        opaque liquid). The total BBPS score equals 9. Findings:      The perianal and digital rectal examinations were normal. Pertinent       negatives include normal sphincter tone and no palpable rectal lesions.      The terminal ileum appeared normal.      A 8 mm polyp was found in the cecum. The polyp was sessile. The polyp       was removed with a hot snare. Resection and retrieval were complete.      A tattoo was seen in the proximal ascending colon near the previous       polypectomy site, there was residual polyp. This was removed with a cold       forceps and a cold snare for histology. To prevent bleeding after the       polypectomy, two hemostatic clips were successfully placed (MR       conditional). There was no bleeding at the end of the procedure.      A 9 mm polyp was found in the ascending colon. The polyp was sessile.       The polyp was removed with a hot snare. Resection and retrieval were       complete. To prevent bleeding after the polypectomy, one hemostatic clip       was successfully placed (MR conditional). There was no bleeding during,       or at the end, of the procedure.      A 5 mm polyp was found in the ascending colon. The polyp was sessile.       The polyp was removed with a cold snare. Resection and retrieval were       complete.      Two sessile polyps were found in the rectum. The polyps were diminutive  in size. These polyps were removed with a cold biopsy forceps. Resection       and retrieval were complete.      The retroflexed view of the distal rectum and anal verge was normal and       showed no anal or rectal abnormalities. Impression:           - The examined portion of the ileum was normal.                       - One 8 mm polyp in the cecum, removed with a hot                        snare. Resected and retrieved.                       - A tattoo was seen in the  proximal ascending colon.                        The tattoo site appeared abnormal. Biopsied. Clips (MR                        conditional) were placed.                       - One 9 mm polyp in the ascending colon, removed with a                        hot snare. Resected and retrieved. Clip (MR                        conditional) was placed.                       - One 5 mm polyp in the ascending colon, removed with a                        cold snare. Resected and retrieved.                       - Two diminutive polyps in the rectum, removed with a                        cold biopsy forceps. Resected and retrieved.                       - The distal rectum and anal verge are normal on                        retroflexion view. Recommendation:       - Discharge patient to home (with spouse).                       - Diabetic (ADA) diet and low sodium diet today.                       - Continue present medications.                       - Await pathology results.                       -  Repeat colonoscopy in 2 years for surveillance of                        multiple polyps. Procedure Code(s):    --- Professional ---                       [phone], Colonoscopy, flexible; with removal of tumor(s),                        polyp(s), or other lesion(s) by snare technique                       45380, 38, Colonoscopy, flexible; with biopsy, single                        or multiple Diagnosis Code(s):    --- Professional ---                       Z86.010, Personal history of colonic polyps                       D12.0, Benign neoplasm of cecum                       D12.2, Benign neoplasm of ascending colon                       K62.1, Rectal polyp CPT copyright 2017 American Medical Association. All rights reserved. The codes documented in this report are preliminary and upon coder review may  be revised to meet current compliance requirements. Dr. Ulyess Mort Lin Landsman MD,  MD 01/11/2018 9:07:48 AM This report has been signed electronically. Number of Addenda: 0 Note Initiated On: 01/11/2018 8:11 AM Scope Withdrawal Time: 0 hours 39 minutes 4 seconds  Total Procedure Duration: 0 hours 42 minutes 22 seconds       [facility]

## 2018-01-11 NOTE — Anesthesia Post-op Follow-up Note (Signed)
Anesthesia QCDR form completed.        
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
## 2018-01-12 ENCOUNTER — Encounter: Payer: Self-pay | Admitting: Gastroenterology

## 2018-01-12 LAB — SURGICAL PATHOLOGY

## 2018-01-14 ENCOUNTER — Ambulatory Visit: Payer: Self-pay | Admitting: Surgery

## 2018-01-15 ENCOUNTER — Encounter: Payer: Self-pay | Admitting: Gastroenterology

## 2018-01-18 ENCOUNTER — Ambulatory Visit: Payer: Self-pay | Admitting: Surgery

## 2018-01-25 ENCOUNTER — Encounter: Payer: Self-pay | Admitting: Surgery

## 2018-01-25 ENCOUNTER — Ambulatory Visit: Payer: BLUE CROSS/BLUE SHIELD | Admitting: Surgery

## 2018-01-25 VITALS — BP 142/78 | HR 82 | Temp 97.7°F | Resp 15 | Ht 70.0 in | Wt 264.0 lb

## 2018-01-25 DIAGNOSIS — K3580 Unspecified acute appendicitis: Secondary | ICD-10-CM

## 2018-01-25 DIAGNOSIS — R1084 Generalized abdominal pain: Secondary | ICD-10-CM | POA: Diagnosis not present

## 2018-01-25 NOTE — Progress Notes (Addendum)
Outpatient Surgical Follow Up  01/25/2018  Richard Gallagher is an 62 y.o. male.   CC: Right lower quadrant pain  HPI: This patient who had a ruptured appendix and was treated conservatively it back in January.  He has had a recent colonoscopy where multiple polyps were identified and a rather large 8 mm polyp was identified in the cecum.  All were benign.  Patient had a recent colonoscopy were multiple polyps were removed mostly from the ascending colon and cecum.  The operative reports as well as the pathology reports of been reviewed by me.  There is some discrepancy as the patient states that Dr. Marius Ditch told him that he needs to have what sounds like a cecectomy.  Patient describes ongoing and nearly constant right lower quadrant pain especially with stretching nausea vomiting fevers or chills Past Medical History:  Diagnosis Date  . Acute appendicitis   . Collagen vascular disease ([place])   . DOE (dyspnea on exertion) 02/04/2017  . Essential hypertension 02/04/2017  . Hypertension   . Moderate obesity    Had been better controlled while he was living in [place]. He gained 30 pounds since returning in January 2018  . Right bundle branch block (RBBB) on electrocardiogram (ECG) 05/2016  . Ruptured appendicitis 06/20/2017  . Thoracic aortic aneurysm without rupture ([place]) 05/2016   Noted on Cardiac Cath with TAA Angio - Aortic Root 5.1 cm    Past Surgical History:  Procedure Laterality Date  . BACK SURGERY    . CARDIAC CATHETERIZATION  05/2016    ([place], [place]. Dr. Modesto Charon) 05/22/16: EF 55%. Normal coronary arteries. Dilated aortic root at 5.1 cm. 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 codominant  . COLONOSCOPY WITH PROPOFOL N/A 08/04/2017   Procedure: COLONOSCOPY WITH PROPOFOL;  Surgeon: Lin Landsman, MD;  Location: [facility] ENDOSCOPY;  Service: Gastroenterology;  Laterality: N/A;  . COLONOSCOPY WITH PROPOFOL N/A 01/11/2018   Procedure:  COLONOSCOPY WITH PROPOFOL;  Surgeon: Lin Landsman, MD;  Location: [facility] ENDOSCOPY;  Service: Gastroenterology;  Laterality: N/A;  . ROTATOR CUFF REPAIR Left     Family History  Problem Relation Age of Onset  . Hypertension Mother   . Diabetes Mother   . Heart disease Mother        He is not sure of details  . Arthritis Mother   . Hypertension Father   . Diabetes Father   . Arthritis Father   . Stroke Brother     Social History:  reports that he has never smoked. He has never used smokeless tobacco. He reports that he drinks alcohol. He reports that he does not use drugs.  Allergies: No Known Allergies  Medications reviewed.   Review of Systems:   Review of Systems  All other systems reviewed and are negative.    Physical Exam:  BP (!) 142/78   Pulse 82   Temp 97.7 F (36.5 C) (Oral)   Resp 15   Ht 5\' 10"  (1.778 m)   Wt 264 lb (119.7 kg)   BMI 37.88 kg/m   Physical Exam  Constitutional: He appears well-developed and well-nourished.  Obese  Eyes: Pupils are equal, round, and reactive to light. EOM are normal.  Cardiovascular: Normal rate and regular rhythm.  Pulmonary/Chest: Effort normal and breath sounds normal.  Abdominal: Soft. He exhibits no distension. There is no tenderness. A hernia is present.  Small umbilical hernia  Vitals reviewed.     No results found  for this or any previous visit (from the past 48 hour(s)). No results found.  Assessment/Plan:  Records thoroughly reviewed.  A chart was brought into the room and the patient's wife points to the area that she thinks was recommended to be removed (of the cecum.)  I will call Dr. Marius Ditch and discuss the findings of virtually all benign polyps with very little malignant potential and the need for further resection.  Patient desires and is almost demanding a scopic appendectomy as he is worried about the risk of recurrent appendicitis.  He is a Administrator and travels extensively.  The  alternative of a right colon was discussed as well however a right colectomy is not indicated in the absence of severe dysplasia etc.  I would like to review with Dr. Marius Ditch her suggestions for possible excision via seek ectomy if that is truly her recommendation.  In my training and experience seek ectomy is fraught with difficulty due to the poor blood supply of the remaining a sending colon and in fact seek ectomy is not taught as a viable option and most circumstances and residency any longer.  I will discuss this with Dr. Marius Ditch and call the patient to discuss potential for further surgery and/or appendectomy and or third opinion via a colorectal surgeon.  Florene Glen, MD, FACS  Addendum: Today 21 August at 1600 hrs. I spoke with Dr. Marius Ditch by telephone.  And I attempted to call Mr. Corporan personally and left a voicemail.  Dr. Marius Ditch and I discussed the findings of her colonoscopies.  I reviewed for her my reluctance to perform a cecostomy as that is not typical of my training and is surgically not considered a safe operation due to watershed issues and blood supply problems with the ileocecal artery.  A formal right hemicolectomy is not indicated in a patient without high-grade dysplasia or unresectable large polyps.  I reminded Dr. Marius Ditch that this patient is very reliable living locally, employed, married with a very attentive wife.  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  I am in full agreement with that part of the planning.  I am in disagreement that a colon resection or seek ectomy be considered in a patient with tubular adenomas with no sign of dysplasia or high risk lesions.  He is a good candidate for continued surveillance.  Dr. Marius Ditch at the termination of our telephone conversation was in agreement with this plan and I will attempt to communicate it to Mr. Falck and proceed with laparoscopic appendectomy in  the near future.

## 2018-01-25 NOTE — Patient Instructions (Addendum)
Follow up appointment to be announced.  

## 2018-01-29 ENCOUNTER — Telehealth: Payer: Self-pay

## 2018-01-29 NOTE — Telephone Encounter (Signed)
Call to patient to see about scheduling laparoscopic appendectomy. The patient is scheduled for surgery at [facility] with Dr Burt Knack on 02/12/18. He will pre admit by phone. He will be seen in office by Dr Burt Knack for a pre op exam on 02/09/18 at 10:15 am. The patient is aware of dates, time, and instructions.

## 2018-02-03 DIAGNOSIS — K5732 Diverticulitis of large intestine without perforation or abscess without bleeding: Secondary | ICD-10-CM | POA: Diagnosis not present

## 2018-02-03 DIAGNOSIS — R109 Unspecified abdominal pain: Secondary | ICD-10-CM | POA: Diagnosis not present

## 2018-02-03 DIAGNOSIS — Z6839 Body mass index (BMI) 39.0-39.9, adult: Secondary | ICD-10-CM | POA: Diagnosis not present

## 2018-02-04 ENCOUNTER — Inpatient Hospital Stay
Admission: RE | Admit: 2018-02-04 | Discharge: 2018-02-04 | Disposition: A | Discharge status: Home / Self Care | Payer: BLUE CROSS/BLUE SHIELD | Source: Ambulatory Visit

## 2018-02-04 NOTE — Pre-Procedure Instructions (Signed)
Richard Gallagher  ECHO COMPLETE WO IMAGING ENHANCING AGENT  Order# [id number]  Reading physician: Larey Dresser, MD Ordering physician: Leonie Man, MD Study date: 02/11/17  Result Notes for ECHOCARDIOGRAM COMPLETE   Notes recorded by Raiford Simmonds, RN on 02/13/2017 at 3:21 PM EDT ECHO REPORT ROUTED INSTEAD OF LETTER. ------  Notes recorded by Raiford Simmonds, RN on 02/13/2017 at 3:17 PM EDT Spoke to patient. Result given . Verbalized understanding Letter sent Dr Davene Costain for clearance for shoulder surgery ------  Notes recorded by Leonie Man, MD on 02/12/2017 at 6:09 PM EDT Echocardiogram result: Mildly thickened LV wall with normal EF 55-60%. Only Grade 1 diastolic dysfunction - probably normal for age & body.  Murmur is probably related to turbulence in the aortic valve area. The measured portion of the ascending aorta is only ~4.6 mm (less than CT evaluation)  Abnormal Right Ventricle function & ~moderately dilated (suggests long-standing lung disease)  Should be fine to proceed with planned operation   Glenetta Hew c      Study Result   Result status: Final result                           [facility]                        [address], [place] [address]                            [phone]  ------------------------------------------------------------------- Transthoracic Echocardiography  Patient:    Richard Gallagher, Richard Gallagher MR #:       [id number] Study Date: 02/11/2017 Gender:     M Age:        62 Height:     175.3 cm Weight:     114.3 kg BSA:        2.41 m^2 Pt. Status: Room:   SONOGRAPHER  Diamond Nickel  ATTENDING    Loralie Champagne, M.D.  ORDERING     Glenetta Hew, MD  [place], MD  PERFORMING   [facility], Outpatient  cc:  ------------------------------------------------------------------- LV EF: 55% -    60%  ------------------------------------------------------------------- Indications:      Z01.810 Preoperative cardiovascular examination. I71.2 Thoracic aortic aneurysm without rupture. R06.00 Dyspnea.  ------------------------------------------------------------------- History:   PMH:  Right bundle branch block. Sleep apnea. Obesity. Risk factors:  Hypertension.  ------------------------------------------------------------------- Study Conclusions  - Left ventricle: The cavity size was normal. Wall thickness was   increased in a pattern of mild LVH. Systolic function was normal.   The estimated ejection fraction was in the range of 55% to 60%.   Although no diagnostic regional wall motion abnormality was   identified, this possibility cannot be completely excluded on the   basis of this study. Doppler parameters are consistent with   abnormal left ventricular relaxation (grade 1 diastolic   dysfunction). - Aortic valve: There was no stenosis. There was trivial   regurgitation. - Aorta: Dilated aortic root and ascending aorta. Ascending aorta   dimension: 43 mm. Aortic root dimension: 46 mm (ED). - Mitral valve: There was no significant regurgitation. - Right ventricle: The cavity size was mildly to moderately   dilated. Systolic function  was mildly reduced. - Right atrium: The atrium was mildly dilated. - Pulmonary arteries: No complete TR doppler jet so unable to   estimate PA systolic pressure. - Inferior vena cava: The vessel was normal in size. The   respirophasic diameter changes were in the normal range (= 50%),   consistent with normal central venous pressure.  Impressions:  - Technically difficult study with poor acoustic windows. Normal LV   size with mild LV hypertrophy. EF 55-60%. Mild to moderate RV   dilation with mildly decreased systolic function. Unable to   estimate PA systolic pressure. Dilated aortic root and ascending    aorta.  ------------------------------------------------------------------- Study data:  No prior study was available for comparison.  Study status:  Routine.  Procedure:  The patient reported no pain pre or post test. Transthoracic echocardiography. Image quality was adequate.  Study completion:  There were no complications. Transthoracic echocardiography.  M-mode, complete 2D, spectral Doppler, and color Doppler.  Birthdate:  Patient birthdate: [birth year].  Age:  Patient is 62 yr old.  Sex:  Gender: male. BMI: 37.2 kg/m^2.  Blood pressure:     136/96  Patient status: Outpatient.  Study date:  Study date: 02/11/2017. Study time: 02:08 PM.  Location:  [facility]  -------------------------------------------------------------------  ------------------------------------------------------------------- Left ventricle:  The cavity size was normal. Wall thickness was increased in a pattern of mild LVH. Systolic function was normal. The estimated ejection fraction was in the range of 55% to 60%. Although no diagnostic regional wall motion abnormality was identified, this possibility cannot be completely excluded on the basis of this study. Doppler parameters are consistent with abnormal left ventricular relaxation (grade 1 diastolic dysfunction).  ------------------------------------------------------------------- Aortic valve:   Trileaflet.  Doppler:   There was no stenosis. There was trivial regurgitation.  ------------------------------------------------------------------- Aorta:  Dilated aortic root and ascending aorta. Ascending aorta dimension: 43 mm.  ------------------------------------------------------------------- Mitral valve:   Normal thickness leaflets .  Doppler:   There was no evidence for stenosis.   There was no significant regurgitation.   ------------------------------------------------------------------- Left atrium:  The atrium was normal in  size.  ------------------------------------------------------------------- Right ventricle:  The cavity size was mildly to moderately dilated. Systolic function was mildly reduced.  ------------------------------------------------------------------- Pulmonic valve:    Structurally normal valve.   Cusp separation was normal.  Doppler:  Transvalvular velocity was within the normal range. There was no regurgitation.  ------------------------------------------------------------------- Tricuspid valve:   Doppler:  There was trivial regurgitation.  ------------------------------------------------------------------- Pulmonary artery:   No complete TR doppler jet so unable to estimate PA systolic pressure.  ------------------------------------------------------------------- Right atrium:  The atrium was mildly dilated.  ------------------------------------------------------------------- Pericardium:  There was no pericardial effusion.  ------------------------------------------------------------------- Systemic veins: Inferior vena cava: The vessel was normal in size. The respirophasic diameter changes were in the normal range (= 50%), consistent with normal central venous pressure.  ------------------------------------------------------------------- Post procedure conclusions Ascending Aorta:  - Dilated aortic root and ascending aorta. Ascending aorta   dimension: 43 mm.  ------------------------------------------------------------------- Measurements   Left ventricle                           Value        Reference  LV ID, ED, PLAX chordal        (L)       41.8  mm     43 - 52  LV ID, ES, PLAX chordal  32.6  mm     23 - 38  LV fx shortening, PLAX chordal (L)       22    %      >=29  LV PW thickness, ED                      14.8  mm     ---------  IVS/LV PW ratio, ED                      0.78         <=1.3  LV e&', lateral                            8.99  cm/s   ---------  LV E/e&', lateral                         6.81         ---------  LV e&', medial                            6.47  cm/s   ---------  LV E/e&', medial                          9.46         ---------  LV e&', average                           7.73  cm/s   ---------  LV E/e&', average                         7.92         ---------    Ventricular septum                       Value        Reference  IVS thickness, ED                        11.6  mm     ---------    LVOT                                     Value        Reference  LVOT ID, S                               24    mm     ---------  LVOT area                                4.52  cm^2   ---------    Aorta                                    Value        Reference  Aortic root ID, ED  46    mm     ---------    Left atrium                              Value        Reference  LA ID, A-P, ES                           37    mm     ---------  LA ID/bsa, A-P                           1.54  cm/m^2 <=2.2  LA volume, S                             36    ml     ---------  LA volume/bsa, S                         15    ml/m^2 ---------  LA volume, ES, 1-p A4C                   26    ml     ---------  LA volume/bsa, ES, 1-p A4C               10.8  ml/m^2 ---------  LA volume, ES, 1-p A2C                   47    ml     ---------  LA volume/bsa, ES, 1-p A2C               19.5  ml/m^2 ---------    Mitral valve                             Value        Reference  Mitral E-wave peak velocity              61.2  cm/s   ---------  Mitral A-wave peak velocity              85.9  cm/s   ---------  Mitral deceleration time       (H)       264   ms     150 - 230  Mitral E/A ratio, peak                   0.7          ---------  Legend: (L)  and  (H)  mark values outside specified reference range.  ------------------------------------------------------------------- Prepared and Electronically Authenticated  by  Loralie Champagne, M.D. 2018-09-05T16:53:32  [facility] Images   Show images for ECHOCARDIOGRAM COMPLETE  Patient Information   Patient Name Urijah, Richard Gallagher Sex Male DOB 07-19-1955 SSN [id number]  Reason for Exam  Priority: Routine  Dx: Thoracic aortic aneurysm without rupture ([place]) [I71.2 (ICD-10-CM)]; DOE (dyspnea on exertion) [R06.09 (ICD-10-CM)]; Pre-operative cardiovascular examination [Z01.810 (ICD-10-CM)]  Comments:   Surgical History   Surgical History   Procedure Laterality Date Comment Source  CARDIAC CATHETERIZATION  05/2016 [place], [place]. Dr. Modesto Charon) 05/22/16: EF 55%. Normal coronary arteries. Dilated aortic root at 5.1 cm. Dual ostia for left coronary  arteries (LAD and LCx). codominant Provider    Other Surgical History   Procedure Laterality Date Comment Source  BACK SURGERY    Provider  COLONOSCOPY WITH PROPOFOL N/A 08/04/2017 Procedure: COLONOSCOPY WITH PROPOFOL; Surgeon: Lin Landsman, MD; Location: [facility] ENDOSCOPY; Service: Gastroenterology; Laterality: N/A; Provider  COLONOSCOPY WITH PROPOFOL N/A 01/11/2018 Procedure: COLONOSCOPY WITH PROPOFOL; Surgeon: Lin Landsman, MD; Location: [facility] ENDOSCOPY; Service: Gastroenterology; Laterality: N/A; Provider  ROTATOR CUFF REPAIR Left   Provider    Performing Technologist/Nurse   Performing Technologist/Nurse: Valinda Hoar, RCS                    Implants    No active implants to display in this view.  Order-Level Documents:   There are no order-level documents.  Encounter-Level Documents - 02/11/2017:   Scan on 02/16/2017 12:01 PM by Default, Provider, MD  Electronic signature on 02/11/2017 1:53 PM - Signed  Electronic signature on 02/11/2017 1:53 PM - Signed      Signed   Electronically signed by Larey Dresser, MD on 02/11/17 at 1653 EDT  Printable Result Report   Result Report   External Result Report   External Result Report

## 2018-02-05 ENCOUNTER — Inpatient Hospital Stay: Admission: RE | Admit: 2018-02-05 | Payer: BLUE CROSS/BLUE SHIELD | Source: Ambulatory Visit

## 2018-02-07 HISTORY — PX: TRANSTHORACIC ECHOCARDIOGRAM: SHX275

## 2018-02-09 ENCOUNTER — Encounter: Payer: Self-pay | Admitting: Surgery

## 2018-02-09 ENCOUNTER — Encounter
Admission: RE | Admit: 2018-02-09 | Discharge: 2018-02-09 | Disposition: A | Discharge status: Home / Self Care | Payer: BLUE CROSS/BLUE SHIELD | Source: Ambulatory Visit | Attending: Surgery | Admitting: Surgery

## 2018-02-09 ENCOUNTER — Other Ambulatory Visit: Payer: Self-pay

## 2018-02-09 ENCOUNTER — Ambulatory Visit: Payer: BLUE CROSS/BLUE SHIELD | Admitting: Surgery

## 2018-02-09 VITALS — BP 122/76 | HR 72 | Temp 97.9°F | Resp 14 | Ht 70.0 in | Wt 257.6 lb

## 2018-02-09 DIAGNOSIS — R1084 Generalized abdominal pain: Secondary | ICD-10-CM | POA: Diagnosis not present

## 2018-02-09 DIAGNOSIS — K3580 Unspecified acute appendicitis: Secondary | ICD-10-CM | POA: Diagnosis not present

## 2018-02-09 HISTORY — DX: Chronic obstructive pulmonary disease, unspecified: J44.9

## 2018-02-09 HISTORY — DX: Sleep apnea, unspecified: G47.30

## 2018-02-09 HISTORY — DX: Acute myocardial infarction, unspecified: I21.9

## 2018-02-09 NOTE — Patient Instructions (Addendum)
Patient is scheduled for surgery on 02/12/18 for a laparoscopic appendectomy.

## 2018-02-09 NOTE — Progress Notes (Signed)
Outpatient Surgical Follow Up  02/09/2018  Richard Gallagher is an 62 y.o. male.   CC: acute appendicitis, hx  HPI: This patient is treated conservatively with a ruptured appendix back in January.  He had subsequent colonoscopies with small low risk polyps identified in the right colon.  I spoken with Dr. Marius Ditch who would suggest that he have a seek ectomy for a polyp that she was not able to completely resect.  We discussed this at length Pres see previous note.  He has low risk lesions.  Past Medical History:  Diagnosis Date  . Acute appendicitis   . Collagen vascular disease ([place])   . DOE (dyspnea on exertion) 02/04/2017  . Essential hypertension 02/04/2017  . Hypertension   . Moderate obesity    Had been better controlled while he was living in [place]. He gained 30 pounds since returning in January 2018  . Right bundle branch block (RBBB) on electrocardiogram (ECG) 05/2016  . Ruptured appendicitis 06/20/2017  . Thoracic aortic aneurysm without rupture ([place]) 05/2016   Noted on Cardiac Cath with TAA Angio - Aortic Root 5.1 cm    Past Surgical History:  Procedure Laterality Date  . BACK SURGERY    . CARDIAC CATHETERIZATION  05/2016    ([place], [place]. Dr. Modesto Charon) 05/22/16: EF 55%. Normal coronary arteries. Dilated aortic root at 5.1 cm. 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 codominant  . COLONOSCOPY WITH PROPOFOL N/A 08/04/2017   Procedure: COLONOSCOPY WITH PROPOFOL;  Surgeon: Lin Landsman, MD;  Location[facility] ENDOSCOPY;  Service: Gastroenterology;  Laterality: N/A;  . COLONOSCOPY WITH PROPOFOL N/A 01/11/2018   Procedure: COLONOSCOPY WITH PROPOFOL;  Surgeon: Lin Landsman, MD;  Location: [facility] ENDOSCOPY;  Service: Gastroenterology;  Laterality: N/A;  . ROTATOR CUFF REPAIR Left     Family History  Problem Relation Age of Onset  . Hypertension Mother   . Diabetes Mother   . Heart disease Mother        He is not sure of details   . Arthritis Mother   . Hypertension Father   . Diabetes Father   . Arthritis Father   . Stroke Brother     Social History:  reports that he has never smoked. He has never used smokeless tobacco. He reports that he drinks alcohol. He reports that he does not use drugs.  Allergies: No Known Allergies  Medications reviewed.   Review of Systems:   Review of Systems  Constitutional: Negative.   HENT: Negative.   Eyes: Negative.   Respiratory: Negative.   Cardiovascular: Negative.   Gastrointestinal: Negative.   Genitourinary: Negative.   Musculoskeletal: Negative.   Skin: Negative.   Neurological: Negative.   Endo/Heme/Allergies: Negative.   Psychiatric/Behavioral: Negative.      Physical Exam:  There were no vitals taken for this visit.  Physical Exam  Constitutional: He is oriented to person, place, and time. He appears well-developed and well-nourished. No distress.  HENT:  Head: Normocephalic and atraumatic.  Eyes: Pupils are equal, round, and reactive to light. EOM are normal. Right eye exhibits no discharge. Left eye exhibits no discharge. No scleral icterus.  Neck: Normal range of motion. Neck supple.  Cardiovascular: Normal rate and regular rhythm.  Pulmonary/Chest: Effort normal and breath sounds normal. No stridor. No respiratory distress.  Abdominal: Soft. He exhibits no distension. There is no tenderness. There is no guarding. A hernia is present.  Small UH  Musculoskeletal: Normal range of motion.  He exhibits no edema or deformity.  Neurological: He is alert and oriented to person, place, and time.  Skin: Skin is warm and dry. He is not diaphoretic.  Vitals reviewed.     No results found for this or any previous visit (from the past 48 hour(s)). No results found.  Assessment/Plan:  This patient with a history of ruptured appendicitis.  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  He is scheduled for  Friday.  Discussed rationale for surgery the options of observation risk of bleeding infection recurrence and conversion to an open procedure he understood and agreed to proceed his wife was present as well.  Did discuss my oversedation with Dr. Marius Ditch.  I believe that there is no indication for a right hemicolectomy in a patient with no sign of high risk lesion or malignancy.  Cecectomy is not indicated either in my surgical training and judgment that is a poor operation because of the risk of anastomotic leak secondary to loss of the ileocecal artery.  He understood this plan and was completely in agreement with this plan as is his wife.  We will plan laparoscopic appendectomy on Friday with possible open.  And the risk of bleeding and infection were reviewed again.  Florene Glen, MD, FACS

## 2018-02-09 NOTE — Patient Instructions (Signed)
Your procedure is scheduled on: 02-12-18 FRIDAY Report to Same Day Surgery 2nd floor medical mall [facility] Entrance-take elevator on left to 2nd floor.  Check in with surgery information desk.) To find out your arrival time please call [phone] between 1PM - 3PM on 02-11-18 THURSDAY  Remember: Instructions that are not followed completely may result in serious medical risk, up to and including death, or upon the discretion of your surgeon and anesthesiologist your surgery may need to be rescheduled.    _x___ 1. Do not eat food after midnight the night before your procedure. NO GUM OR CANDY AFTER MIDNIGHT.  You may drink clear liquids up to 2 hours before you are scheduled to arrive at the hospital for your procedure.  Do not drink clear liquids within 2 hours of your scheduled arrival to the hospital.  Clear liquids include  --Water or Apple juice without pulp  --Clear carbohydrate beverage such as ClearFast or Gatorade  --Black Coffee or Clear Tea (No milk, no creamers, do not add anything to the coffee or Tea   ____Ensure clear carbohydrate drink on the way to the hospital for bariatric patients  ____Ensure clear carbohydrate drink 3 hours before surgery for Dr Dwyane Luo patients if physician instructed.     __x__ 2. No Alcohol for 24 hours before or after surgery.   __x__3. No Smoking or e-cigarettes for 24 prior to surgery.  Do not use any chewable tobacco products for at least 6 hour prior to surgery   ____  4. Bring all medications with you on the day of surgery if instructed.    __x__ 5. Notify your doctor if there is any change in your medical condition     (cold, fever, infections).    x___6. On the morning of surgery brush your teeth with toothpaste and water.  You may rinse your mouth with mouth wash if you wish.  Do not swallow any toothpaste or mouthwash.   Do not wear jewelry, make-up, hairpins, clips or nail polish.  Do not wear lotions, powders, or perfumes. You  may wear deodorant.  Do not shave 48 hours prior to surgery. Men may shave face and neck.  Do not bring valuables to the hospital.    [facility] is not responsible for any belongings or valuables.               Contacts, dentures or bridgework may not be worn into surgery.  Leave your suitcase in the car. After surgery it may be brought to your room.  For patients admitted to the hospital, discharge time is determined by your treatment team.  _  Patients discharged the day of surgery will not be allowed to drive home.  You will need someone to drive you home and stay with you the night of your procedure.    Please read over the following fact sheets that you were given:   [facility] Preparing for Surgery   _x___ TAKE THE FOLLOWING MEDICATION THE MORNING OF SURGERY WITH A SMALL SIP OF WATER. These include:  1. ATENOLOL  2.  3.  4.  5.  6.  ____Fleets enema or Magnesium Citrate as directed.   _x___ Use CHG Soap or sage wipes as directed on instruction sheet   ____ Use inhalers on the day of surgery and bring to hospital day of surgery  ____ Stop Metformin and Janumet 2 days prior to surgery.    ____ Take 1/2 of usual insulin dose the night before  surgery and none on the morning surgery.   _x___ Follow recommendations from Cardiologist, Pulmonologist or PCP regarding stopping Aspirin, Coumadin, Plavix ,Eliquis, Effient, or Pradaxa, and Pletal-CALL DR COOPERS OFFICE REGARDING YOUR ASPIRIN  X____Stop Anti-inflammatories such as Advil, Aleve, Ibuprofen, Motrin, Naproxen, Naprosyn, Goodies powders or aspirin products NOW-OK to take Tylenol    _x___ Stop supplements until after surgery-STOP [place]   ____ Bring C-Pap to the hospital.

## 2018-02-09 NOTE — Addendum Note (Signed)
Addended by: Florene Glen on: 02/09/2018 10:50 AM   Modules accepted: Orders, SmartSet

## 2018-02-10 ENCOUNTER — Inpatient Hospital Stay
Admission: RE | Admit: 2018-02-10 | Discharge: 2018-02-10 | Disposition: A | Discharge status: Home / Self Care | Payer: BLUE CROSS/BLUE SHIELD | Source: Ambulatory Visit

## 2018-02-10 ENCOUNTER — Telehealth: Payer: Self-pay | Admitting: *Deleted

## 2018-02-10 ENCOUNTER — Telehealth: Payer: Self-pay

## 2018-02-10 NOTE — Pre-Procedure Instructions (Signed)
SENT FAX TO DR HARDINGS OFFICE YESTERDAY AFTERNOON WITH FAX CONFIRMATION RECEIVED. CALLED CARYL-LYN AT DR COOPERS OFFICE AND INFORMED HER OF PT NEEDING TO BE OPTIMIZED PRIOR TO HIS SURGERY DUE TO TAA-  FAX SENT TO DR COOPERS OFFICE WITH FAX CONFIRMATION RECEIVED.  CALLED DR HARDINGS OFFICE TO MAKE SURE THEY RECEIVED THE FAX AND SHE GAVE ME ANOTHER OFFICE FAX # AND I SENT IT TO THAT OFFICE WITH FAX CONFIRMATION RECEIVED

## 2018-02-10 NOTE — Telephone Encounter (Signed)
Heather with pre admit testing called and said that the patient will need to have clearance for surgery by his Cardiologist due to history of aortic aneurysm. They have faxed the request to Dr Glenetta Hew at [facility].

## 2018-02-10 NOTE — Telephone Encounter (Signed)
   [facility] Pre-operative Risk Assessment    Request for surgical clearance:  1. What type of surgery is being performed? LAP APPENDECTOMY, UMBILICAL HERNIA REPAIR  2. When is this surgery scheduled? 02/12/2018  3. What type of clearance is required (medical clearance vs. Pharmacy clearance to hold med vs. Both)? MEDICAL-PT WITH AORTIC ANEURYSM-PLEASE OPTIMIZE  4. Are there any medications that need to be held prior to surgery and how long?  5. Practice name and name of physician performing surgery? [facility]  6. What is your office phone number [phone]   7.   What is your office fax number  [phone]  8.   Anesthesia type (None, local, MAC, general) ? NOT LISTED   Richard Gallagher 02/10/2018, 3:20 PM  _________________________________________________________________   (provider comments below)

## 2018-02-11 ENCOUNTER — Encounter
Admission: RE | Admit: 2018-02-11 | Discharge: 2018-02-11 | Disposition: A | Discharge status: Home / Self Care | Payer: BLUE CROSS/BLUE SHIELD | Source: Ambulatory Visit | Attending: Surgery | Admitting: Surgery

## 2018-02-11 ENCOUNTER — Encounter: Payer: Self-pay | Admitting: *Deleted

## 2018-02-11 DIAGNOSIS — I719 Aortic aneurysm of unspecified site, without rupture: Secondary | ICD-10-CM | POA: Insufficient documentation

## 2018-02-11 DIAGNOSIS — I10 Essential (primary) hypertension: Secondary | ICD-10-CM | POA: Diagnosis not present

## 2018-02-11 DIAGNOSIS — I491 Atrial premature depolarization: Secondary | ICD-10-CM | POA: Diagnosis not present

## 2018-02-11 DIAGNOSIS — I4510 Unspecified right bundle-branch block: Secondary | ICD-10-CM | POA: Diagnosis not present

## 2018-02-11 DIAGNOSIS — Z01818 Encounter for other preprocedural examination: Secondary | ICD-10-CM | POA: Diagnosis not present

## 2018-02-11 LAB — BASIC METABOLIC PANEL
Anion gap: 8 (ref 5–15)
BUN: 16 mg/dL (ref 8–23)
CO2: 28 mmol/L (ref 22–32)
CREATININE: 0.8 mg/dL (ref 0.61–1.24)
Calcium: 8.4 mg/dL — ABNORMAL LOW (ref 8.9–10.3)
Chloride: 104 mmol/L (ref 98–111)
GFR calc Af Amer: >60 mL/min (ref >60)
GLUCOSE: 151 mg/dL — AB (ref 70–99)
Potassium: 3.2 mmol/L — ABNORMAL LOW (ref 3.5–5.1)
Sodium: 140 mmol/L (ref 135–145)

## 2018-02-11 LAB — CBC WITH DIFFERENTIAL/PLATELET
Basophils Absolute: 0.1 10*3/uL (ref 0–0.1)
Basophils Relative: 1 %
EOS PCT: 5 %
Eosinophils Absolute: 0.6 10*3/uL (ref 0–0.7)
HEMATOCRIT: 48 % (ref 40.0–52.0)
Hemoglobin: 16.5 g/dL (ref 13.0–18.0)
Lymphocytes Relative: 13 %
Lymphs Abs: 1.6 10*3/uL (ref 1.0–3.6)
MCH: 29.1 pg (ref 26.0–34.0)
MCHC: 34.3 g/dL (ref 32.0–36.0)
MCV: 84.8 fL (ref 80.0–100.0)
MONO ABS: 1.2 10*3/uL — AB (ref 0.2–1.0)
MONOS PCT: 11 %
NEUTROS ABS: 8.3 10*3/uL — AB (ref 1.4–6.5)
Neutrophils Relative %: 70 %
PLATELETS: 262 10*3/uL (ref 150–440)
RBC: 5.66 MIL/uL (ref 4.40–5.90)
RDW: 14 % (ref 11.5–14.5)
WBC: 11.8 10*3/uL — ABNORMAL HIGH (ref 3.8–10.6)

## 2018-02-11 NOTE — Pre-Procedure Instructions (Signed)
SPOKE WITH DR ADAMS REGARDING CARDIAC CLEARANCE.  MICHELE FROM DR COOPERS OFFICE CALLED AND SAID THAT DR Burt Knack SAID PT DID NOT NEED CARDIAC CLEARANCE. INFORMED DR ADAMS OF PT H/O TAA, COPD, HTN, SLEEP APNEA AND MI (2017).  PT CAME IN AND HAD EKG DONE. REVIEWED THIS WITH DR ADAMS. DR ADAMS Geraldine Solar PT TO HAVE CARDIAC CLEARANCE.

## 2018-02-11 NOTE — Telephone Encounter (Signed)
Follow    Richard Gallagher calling to check on the surgical clearance, wants to know if it will be address before the pts procedure tomorrow 02/12/18

## 2018-02-11 NOTE — Telephone Encounter (Signed)
   Primary Cardiologist:David Ellyn Hack, MD  (Last seen 01/2017)  Chart reviewed as part of pre-operative protocol coverage. Because of Rollan Roger Accomando's past medical history and time since last visit, he/she will require a follow-up visit in order to better assess preoperative cardiovascular risk.  Pre-op covering staff: - Please schedule appointment and call patient to inform them. - Please contact requesting surgeon's office via preferred method (i.e, phone, fax) to inform them of need for appointment prior to surgery.  [place], [place]  02/11/2018, 1:32 PM

## 2018-02-11 NOTE — Telephone Encounter (Signed)
Spoke with pt and made him aware that he would need to be seen before he could be cleared for his upcoming sx 9/619. Pt will see Melina Copa, PA-C 02-15-18. Pt is agreeable.  I have spoken with Caryl Pina from Dr. Kathrynn Humble office to make her aware that pt's surgery will have to be cancelled until he is seen.

## 2018-02-12 ENCOUNTER — Encounter: Payer: Self-pay | Admitting: Physician Assistant

## 2018-02-15 ENCOUNTER — Encounter: Payer: Self-pay | Admitting: Physician Assistant

## 2018-02-15 ENCOUNTER — Ambulatory Visit (HOSPITAL_COMMUNITY): Payer: BLUE CROSS/BLUE SHIELD | Attending: Cardiology

## 2018-02-15 ENCOUNTER — Telehealth: Payer: Self-pay | Admitting: *Deleted

## 2018-02-15 ENCOUNTER — Other Ambulatory Visit: Payer: Self-pay

## 2018-02-15 ENCOUNTER — Ambulatory Visit: Payer: BLUE CROSS/BLUE SHIELD | Admitting: Medical

## 2018-02-15 VITALS — BP 118/72 | HR 66 | Ht 70.0 in | Wt 261.6 lb

## 2018-02-15 DIAGNOSIS — Z01810 Encounter for preprocedural cardiovascular examination: Secondary | ICD-10-CM | POA: Diagnosis not present

## 2018-02-15 DIAGNOSIS — I082 Rheumatic disorders of both aortic and tricuspid valves: Secondary | ICD-10-CM | POA: Diagnosis not present

## 2018-02-15 DIAGNOSIS — I110 Hypertensive heart disease with heart failure: Secondary | ICD-10-CM | POA: Insufficient documentation

## 2018-02-15 DIAGNOSIS — Z6837 Body mass index (BMI) 37.0-37.9, adult: Secondary | ICD-10-CM | POA: Insufficient documentation

## 2018-02-15 DIAGNOSIS — E669 Obesity, unspecified: Secondary | ICD-10-CM | POA: Diagnosis not present

## 2018-02-15 DIAGNOSIS — I509 Heart failure, unspecified: Secondary | ICD-10-CM | POA: Diagnosis not present

## 2018-02-15 DIAGNOSIS — I5189 Other ill-defined heart diseases: Secondary | ICD-10-CM

## 2018-02-15 DIAGNOSIS — E876 Hypokalemia: Secondary | ICD-10-CM | POA: Diagnosis not present

## 2018-02-15 DIAGNOSIS — I77810 Thoracic aortic ectasia: Secondary | ICD-10-CM

## 2018-02-15 DIAGNOSIS — I4510 Unspecified right bundle-branch block: Secondary | ICD-10-CM | POA: Insufficient documentation

## 2018-02-15 DIAGNOSIS — I491 Atrial premature depolarization: Secondary | ICD-10-CM

## 2018-02-15 DIAGNOSIS — I493 Ventricular premature depolarization: Secondary | ICD-10-CM

## 2018-02-15 LAB — ECHOCARDIOGRAM COMPLETE
HEIGHTINCHES: 70 in
WEIGHTICAEL: 4185.6 [oz_av]

## 2018-02-15 NOTE — Telephone Encounter (Signed)
Per Roby Lofts, PA-C, called the surgeon's office, spoke with Judeen Hammans, to let them know that we took the precaution and advised the pt not take anymore Aspirin until after his procedure which is scheduled for 02/19/18, until he was advised by his surgeon. Judeen Hammans thanked me for the call.
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
## 2018-02-15 NOTE — Patient Instructions (Addendum)
Medication Instructions:  Your physician recommends that you continue on your current medications as directed. Please refer to the Current Medication list given to you today. STOP YOUR ASPIRIN AS OF TODAY UNTIL SURGEON ADVISES YOU OTHER WISE   Labwork: TODAY:  BMET & MAGNESIUM  Testing/Procedures: Your physician has requested that you have an echocardiogram. Echocardiography is a painless test that uses sound waves to create images of your heart. It provides your doctor with information about the size and shape of your heart and how well your heart's chambers and valves are working. This procedure takes approximately one hour. There are no restrictions for this procedure.    Follow-Up: Your physician wants you to follow-up in: [place] DR. HARDING  You will receive a reminder letter in the mail two months in advance. If you don't receive a letter, please call our office to schedule the follow-up appointment.    Any Other Special Instructions Will Be Listed Below (If Applicable). Echocardiogram An echocardiogram, or echocardiography, uses sound waves (ultrasound) to produce an image of your heart. The echocardiogram is simple, painless, obtained within a short period of time, and offers valuable information to your health care provider. The images from an echocardiogram can provide information such as:  Evidence of coronary artery disease (CAD).  Heart size.  Heart muscle function.  Heart valve function.  Aneurysm detection.  Evidence of a past heart attack.  Fluid buildup around the heart.  Heart muscle thickening.  Assess heart valve function.  Tell a health care provider about:  Any allergies you have.  All medicines you are taking, including vitamins, herbs, eye drops, creams, and over-the-counter medicines.  Any problems you or family members have had with anesthetic medicines.  Any blood disorders you have.  Any surgeries you have had.  Any medical  conditions you have.  Whether you are pregnant or may be pregnant. What happens before the procedure? No special preparation is needed. Eat and drink normally. What happens during the procedure?  In order to produce an image of your heart, gel will be applied to your chest and a wand-like tool (transducer) will be moved over your chest. The gel will help transmit the sound waves from the transducer. The sound waves will harmlessly bounce off your heart to allow the heart images to be captured in real-time motion. These images will then be recorded.  You may need an IV to receive a medicine that improves the quality of the pictures. What happens after the procedure? You may return to your normal schedule including diet, activities, and medicines, unless your health care provider tells you otherwise. This information is not intended to replace advice given to you by your health care provider. Make sure you discuss any questions you have with your health care provider. Document Released: 05/23/2000 Document Revised: 01/12/2016 Document Reviewed: 01/31/2013 Elsevier Interactive Patient Education  2017 [employer].     If you need a refill on your cardiac medications before your next appointment, please call your pharmacy.   Potassium Content of Foods Potassium is a mineral found in many foods and drinks. It helps keep fluids and minerals balanced in your body and affects how steadily your heart beats. Potassium also helps control your blood pressure and keep your muscles and nervous system healthy. Certain health conditions and medicines may change the balance of potassium in your body. When this happens, you can help balance your level of potassium through the foods that you do or do not eat.  Your health care provider or dietitian may recommend an amount of potassium that you should have each day. The following lists of foods provide the amount of potassium (in parentheses) per serving in each  item. High in potassium The following foods and beverages have 200 mg or more of potassium per serving:  Apricots, 2 raw or 5 dry (200 mg).  Artichoke, 1 medium (345 mg).  Avocado, raw,  each (245 mg).  Banana, 1 medium (425 mg).  Beans, lima, or baked beans, canned,  cup (280 mg).  Beans, white, canned,  cup (595 mg).  Beef roast, 3 oz (320 mg).  Beef, ground, 3 oz (270 mg).  Beets, raw or cooked,  cup (260 mg).  Bran muffin, 2 oz (300 mg).  Broccoli,  cup (230 mg).  Brussels sprouts,  cup (250 mg).  Cantaloupe,  cup (215 mg).  Cereal, 100% bran,  cup (200-400 mg).  Cheeseburger, single, fast food, 1 each (225-400 mg).  Chicken, 3 oz (220 mg).  Clams, canned, 3 oz (535 mg).  Crab, 3 oz (225 mg).  Dates, 5 each (270 mg).  Dried beans and peas,  cup (300-475 mg).  Figs, dried, 2 each (260 mg).  Fish: halibut, tuna, cod, snapper, 3 oz (480 mg).  Fish: salmon, haddock, swordfish, perch, 3 oz (300 mg).  Fish, tuna, canned 3 oz (200 mg).  [place] fries, fast food, 3 oz (470 mg).  Granola with fruit and nuts,  cup (200 mg).  Grapefruit juice,  cup (200 mg).  Greens, beet,  cup (655 mg).  Honeydew melon,  cup (200 mg).  Kale, raw, 1 cup (300 mg).  Kiwi, 1 medium (240 mg).  Kohlrabi, rutabaga, parsnips,  cup (280 mg).  Lentils,  cup (365 mg).  Mango, 1 each (325 mg).  Milk, chocolate, 1 cup (420 mg).  Milk: nonfat, low-fat, whole, buttermilk, 1 cup (350-380 mg).  Molasses, 1 Tbsp (295 mg).  Mushrooms,  cup (280) mg.  Nectarine, 1 each (275 mg).  Nuts: almonds, peanuts, hazelnuts, [place], cashew, mixed, 1 oz (200 mg).  Nuts, pistachios, 1 oz (295 mg).  Orange, 1 each (240 mg).  Orange juice,  cup (235 mg).  Papaya, medium,  fruit (390 mg).  Peanut butter, chunky, 2 Tbsp (240 mg).  Peanut butter, smooth, 2 Tbsp (210 mg).  Pear, 1 medium (200 mg).  Pomegranate, 1 whole (400 mg).  Pomegranate juice,  cup (215  mg).  Pork, 3 oz (350 mg).  Potato chips, salted, 1 oz (465 mg).  Potato, baked with skin, 1 medium (925 mg).  Potatoes, boiled,  cup (255 mg).  Potatoes, mashed,  cup (330 mg).  Prune juice,  cup (370 mg).  Prunes, 5 each (305 mg).  Pudding, chocolate,  cup (230 mg).  Pumpkin, canned,  cup (250 mg).  Raisins, seedless,  cup (270 mg).  Seeds, sunflower or pumpkin, 1 oz (240 mg).  Soy milk, 1 cup (300 mg).  Spinach,  cup (420 mg).  Spinach, canned,  cup (370 mg).  Sweet potato, baked with skin, 1 medium (450 mg).  Swiss chard,  cup (480 mg).  Tomato or vegetable juice,  cup (275 mg).  Tomato sauce or puree,  cup (400-550 mg).  Tomato, raw, 1 medium (290 mg).  Tomatoes, canned,  cup (200-300 mg).  [place], 3 oz (250 mg).  Wheat germ, 1 oz (250 mg).  Winter squash,  cup (250 mg).  Yogurt, plain or fruited, 6 oz (260-435 mg).  Zucchini,  cup (  220 mg).  Moderate in potassium The following foods and beverages have 50-200 mg of potassium per serving:  Apple, 1 each (150 mg).  Apple juice,  cup (150 mg).  Applesauce,  cup (90 mg).  Apricot nectar,  cup (140 mg).  Asparagus, small spears,  cup or 6 spears (155 mg).  Bagel, cinnamon raisin, 1 each (130 mg).  Bagel, egg or plain, 4 in., 1 each (70 mg).  Beans, green,  cup (90 mg).  Beans, yellow,  cup (190 mg).  Beer, regular, 12 oz (100 mg).  Beets, canned,  cup (125 mg).  Blackberries,  cup (115 mg).  Blueberries,  cup (60 mg).  Bread, whole wheat, 1 slice (70 mg).  Broccoli, raw,  cup (145 mg).  Cabbage,  cup (150 mg).  Carrots, cooked or raw,  cup (180 mg).  Cauliflower, raw,  cup (150 mg).  Celery, raw,  cup (155 mg).  Cereal, bran flakes, cup (120-150 mg).  Cheese, cottage,  cup (110 mg).  Cherries, 10 each (150 mg).  Chocolate, 1 oz bar (165 mg).  Coffee, brewed 6 oz (90 mg).  Corn,  cup or 1 ear (195 mg).  Cucumbers,  cup (80  mg).  Egg, large, 1 each (60 mg).  Eggplant,  cup (60 mg).  Endive, raw, cup (80 mg).  English muffin, 1 each (65 mg).  Fish, orange roughy, 3 oz (150 mg).  Frankfurter, beef or pork, 1 each (75 mg).  Fruit cocktail,  cup (115 mg).  Grape juice,  cup (170 mg).  Grapefruit,  fruit (175 mg).  Grapes,  cup (155 mg).  Greens: kale, turnip, collard,  cup (110-150 mg).  Ice cream or frozen yogurt, chocolate,  cup (175 mg).  Ice cream or frozen yogurt, vanilla,  cup (120-150 mg).  Lemons, limes, 1 each (80 mg).  Lettuce, all types, 1 cup (100 mg).  Mixed vegetables,  cup (150 mg).  Mushrooms, raw,  cup (110 mg).  Nuts: walnuts, pecans, or macadamia, 1 oz (125 mg).  Oatmeal,  cup (80 mg).  Okra,  cup (110 mg).  Onions, raw,  cup (120 mg).  Peach, 1 each (185 mg).  Peaches, canned,  cup (120 mg).  Pears, canned,  cup (120 mg).  Peas, green, frozen,  cup (90 mg).  Peppers, green,  cup (130 mg).  Peppers, red,  cup (160 mg).  Pineapple juice,  cup (165 mg).  Pineapple, fresh or canned,  cup (100 mg).  Plums, 1 each (105 mg).  Pudding, vanilla,  cup (150 mg).  Raspberries,  cup (90 mg).  Rhubarb,  cup (115 mg).  Rice, wild,  cup (80 mg).  Shrimp, 3 oz (155 mg).  Spinach, raw, 1 cup (170 mg).  Strawberries,  cup (125 mg).  Summer squash  cup (175-200 mg).  Swiss chard, raw, 1 cup (135 mg).  Tangerines, 1 each (140 mg).  Tea, brewed, 6 oz (65 mg).  Turnips,  cup (140 mg).  Watermelon,  cup (85 mg).  Wine, red, table, 5 oz (180 mg).  Wine, white, table, 5 oz (100 mg).  Low in potassium The following foods and beverages have less than 50 mg of potassium per serving.  Bread, white, 1 slice (30 mg).  Carbonated beverages, 12 oz (less than 5 mg).  Cheese, 1 oz (20-30 mg).  Cranberries,  cup (45 mg).  Cranberry juice cocktail,  cup (20 mg).  Fats and oils, 1 Tbsp (less than 5 mg).  Hummus, 1 Tbsp (32  mg).  Nectar: papaya, mango, or pear,  cup (35 mg).  Rice, white or brown,  cup (50 mg).  Spaghetti or macaroni,  cup cooked (30 mg).  Tortilla, flour or corn, 1 each (50 mg).  Waffle, 4 in., 1 each (50 mg).  Water chestnuts,  cup (40 mg).  This information is not intended to replace advice given to you by your health care provider. Make sure you discuss any questions you have with your health care provider. Document Released: 01/07/2005 Document Revised: 11/01/2015 Document Reviewed: 04/22/2013 Elsevier Interactive Patient Education  [employer].

## 2018-02-16 LAB — BASIC METABOLIC PANEL
BUN / CREAT RATIO: 18 (ref 10–24)
BUN: 15 mg/dL (ref 8–27)
CO2: 26 mmol/L (ref 20–29)
Calcium: 9.3 mg/dL (ref 8.6–10.2)
Chloride: 102 mmol/L (ref 96–106)
Creatinine, Ser: 0.83 mg/dL (ref 0.76–1.27)
GFR calc non Af Amer: 94 mL/min/{1.73_m2} (ref >59)
GFR, EST AFRICAN AMERICAN: 109 mL/min/{1.73_m2} (ref >59)
Glucose: 91 mg/dL (ref 65–99)
Potassium: 3.9 mmol/L (ref 3.5–5.2)
SODIUM: 144 mmol/L (ref 134–144)

## 2018-02-16 LAB — MAGNESIUM: Magnesium: 2.3 mg/dL (ref 1.6–2.3)

## 2018-02-17 ENCOUNTER — Telehealth: Payer: Self-pay

## 2018-02-17 ENCOUNTER — Telehealth: Payer: Self-pay | Admitting: Cardiology

## 2018-02-17 ENCOUNTER — Telehealth: Payer: Self-pay | Admitting: *Deleted

## 2018-02-17 NOTE — Telephone Encounter (Signed)
Patient was contacted today and notified that we have received cardiac clearance. We can proceed with surgery as scheduled for 02-19-18 at [facility] with Dr. Burt Knack.  The patient was instructed to call the office should he have further questions. He verbalizes understanding.

## 2018-02-17 NOTE — Telephone Encounter (Signed)
° °  Spouse calling to get clarification on lab results and recommendation

## 2018-02-17 NOTE — Telephone Encounter (Signed)
-----   Message from Abigail Butts, PA-C sent at 02/17/2018  9:42 AM EDT ----- Please notify the patient that his blood work was normal and his potassium was at a good level. Also, his ultrasound of his heart was stable and a message was sent to his surgeon that he was cleared for surgery this week. Thank you!

## 2018-02-17 NOTE — Telephone Encounter (Signed)
Notes recorded by Frederik Schmidt, RN on 02/17/2018 at 9:54 AM EDT Informed patient of lab/ultrasound results-recommendations. He verbalized understanding.

## 2018-02-17 NOTE — Telephone Encounter (Signed)
Spoke with patient's wife and informed her of patient's lab results.  She had a question about pre-procedure orders. I told her to contact the general surgeon's office for further recommendations.

## 2018-02-18 NOTE — Pre-Procedure Instructions (Signed)
CARDIAC CLEARANCE ON CHART 

## 2018-02-19 ENCOUNTER — Encounter: Payer: Self-pay | Admitting: *Deleted

## 2018-02-19 ENCOUNTER — Ambulatory Visit: Payer: BLUE CROSS/BLUE SHIELD | Admitting: Anesthesiology

## 2018-02-19 ENCOUNTER — Observation Stay
Admission: RE | Admit: 2018-02-19 | Discharge: 2018-02-20 | Disposition: A | Discharge status: Home / Self Care | Payer: BLUE CROSS/BLUE SHIELD | Source: Ambulatory Visit | Attending: Surgery | Admitting: Surgery

## 2018-02-19 ENCOUNTER — Other Ambulatory Visit: Payer: Self-pay

## 2018-02-19 ENCOUNTER — Encounter
Admission: RE | Disposition: A | Discharge status: Home / Self Care | Payer: Self-pay | Source: Ambulatory Visit | Attending: Surgery

## 2018-02-19 DIAGNOSIS — I2510 Atherosclerotic heart disease of native coronary artery without angina pectoris: Secondary | ICD-10-CM | POA: Insufficient documentation

## 2018-02-19 DIAGNOSIS — G4730 Sleep apnea, unspecified: Secondary | ICD-10-CM | POA: Diagnosis not present

## 2018-02-19 DIAGNOSIS — I739 Peripheral vascular disease, unspecified: Secondary | ICD-10-CM | POA: Diagnosis not present

## 2018-02-19 DIAGNOSIS — Z6837 Body mass index (BMI) 37.0-37.9, adult: Secondary | ICD-10-CM | POA: Diagnosis not present

## 2018-02-19 DIAGNOSIS — I10 Essential (primary) hypertension: Secondary | ICD-10-CM | POA: Diagnosis not present

## 2018-02-19 DIAGNOSIS — I712 Thoracic aortic aneurysm, without rupture: Secondary | ICD-10-CM | POA: Insufficient documentation

## 2018-02-19 DIAGNOSIS — E669 Obesity, unspecified: Secondary | ICD-10-CM | POA: Diagnosis not present

## 2018-02-19 DIAGNOSIS — K36 Other appendicitis: Secondary | ICD-10-CM

## 2018-02-19 DIAGNOSIS — I252 Old myocardial infarction: Secondary | ICD-10-CM | POA: Diagnosis not present

## 2018-02-19 DIAGNOSIS — K388 Other specified diseases of appendix: Principal | ICD-10-CM | POA: Insufficient documentation

## 2018-02-19 DIAGNOSIS — J449 Chronic obstructive pulmonary disease, unspecified: Secondary | ICD-10-CM | POA: Diagnosis not present

## 2018-02-19 DIAGNOSIS — K37 Unspecified appendicitis: Secondary | ICD-10-CM | POA: Diagnosis not present

## 2018-02-19 DIAGNOSIS — Z8719 Personal history of other diseases of the digestive system: Secondary | ICD-10-CM | POA: Diagnosis not present

## 2018-02-19 DIAGNOSIS — D121 Benign neoplasm of appendix: Secondary | ICD-10-CM | POA: Diagnosis not present

## 2018-02-19 DIAGNOSIS — K429 Umbilical hernia without obstruction or gangrene: Secondary | ICD-10-CM

## 2018-02-19 HISTORY — DX: Thoracic aortic aneurysm, without rupture: I71.2

## 2018-02-19 HISTORY — DX: Headache: R51

## 2018-02-19 HISTORY — DX: Thoracic aortic aneurysm, without rupture, unspecified: I71.20

## 2018-02-19 HISTORY — PX: LAPAROSCOPIC APPENDECTOMY: SHX408

## 2018-02-19 HISTORY — DX: Unspecified osteoarthritis, unspecified site: M19.90

## 2018-02-19 HISTORY — DX: Cardiac arrhythmia, unspecified: I49.9

## 2018-02-19 HISTORY — PX: UMBILICAL HERNIA REPAIR: SHX196

## 2018-02-19 HISTORY — DX: Headache, unspecified: R51.9

## 2018-02-19 LAB — CBC
HCT: 44.3 % (ref 40.0–52.0)
Hemoglobin: 15.6 g/dL (ref 13.0–18.0)
MCH: 29.8 pg (ref 26.0–34.0)
MCHC: 35.2 g/dL (ref 32.0–36.0)
MCV: 84.6 fL (ref 80.0–100.0)
PLATELETS: 219 10*3/uL (ref 150–440)
RBC: 5.24 MIL/uL (ref 4.40–5.90)
RDW: 13.7 % (ref 11.5–14.5)
WBC: 13.2 10*3/uL — AB (ref 3.8–10.6)

## 2018-02-19 LAB — CREATININE, SERUM
Creatinine, Ser: 0.89 mg/dL (ref 0.61–1.24)
GFR calc non Af Amer: >60 mL/min (ref >60)

## 2018-02-19 SURGERY — APPENDECTOMY, LAPAROSCOPIC
Anesthesia: General | Site: Abdomen

## 2018-02-19 MED ORDER — HYDROMORPHONE HCL 1 MG/ML IJ SOLN
INTRAMUSCULAR | Status: AC
  Administered 2018-02-19: 0.25 mg via INTRAVENOUS
  Filled 2018-02-19: qty 1

## 2018-02-19 MED ORDER — HYDROMORPHONE HCL 1 MG/ML IJ SOLN
0.2500 mg | INTRAMUSCULAR | Status: DC | PRN
  Administered 2018-02-19 (×3): 0.25 mg via INTRAVENOUS

## 2018-02-19 MED ORDER — ONDANSETRON HCL 4 MG PO TABS: 4.0000 mg | ORAL_TABLET | Freq: Four times a day (QID) | ORAL | Status: DC | PRN

## 2018-02-19 MED ORDER — ONDANSETRON HCL 4 MG/2ML IJ SOLN
INTRAMUSCULAR | Status: DC | PRN
  Administered 2018-02-19: 4 mg via INTRAVENOUS

## 2018-02-19 MED ORDER — LIDOCAINE HCL (CARDIAC) PF 100 MG/5ML IV SOSY
PREFILLED_SYRINGE | INTRAVENOUS | Status: DC | PRN
  Administered 2018-02-19: 100 mg via INTRAVENOUS

## 2018-02-19 MED ORDER — HYDROCHLOROTHIAZIDE 25 MG PO TABS
25.0000 mg | ORAL_TABLET | Freq: Every day | ORAL | Status: DC
  Administered 2018-02-20: 25 mg via ORAL
  Filled 2018-02-19: qty 1

## 2018-02-19 MED ORDER — OXYCODONE HCL 5 MG PO TABS: 5.0000 mg | ORAL_TABLET | Freq: Once | ORAL | Status: DC | PRN

## 2018-02-19 MED ORDER — ASPIRIN EC 81 MG PO TBEC
81.0000 mg | DELAYED_RELEASE_TABLET | Freq: Every day | ORAL | Status: DC
  Administered 2018-02-20: 81 mg via ORAL
  Filled 2018-02-19: qty 1

## 2018-02-19 MED ORDER — ROCURONIUM BROMIDE 50 MG/5ML IV SOLN
INTRAVENOUS | Status: AC
  Filled 2018-02-19: qty 1

## 2018-02-19 MED ORDER — HEPARIN SODIUM (PORCINE) 5000 UNIT/ML IJ SOLN
INTRAMUSCULAR | Status: AC
  Administered 2018-02-19: 5000 [IU] via SUBCUTANEOUS
  Filled 2018-02-19: qty 1

## 2018-02-19 MED ORDER — OXYCODONE HCL 5 MG/5ML PO SOLN: 5.0000 mg | Freq: Once | ORAL | Status: DC | PRN

## 2018-02-19 MED ORDER — SODIUM CHLORIDE 0.9 % IV SOLN
2.0000 g | Freq: Once | INTRAVENOUS | Status: AC
  Administered 2018-02-19: 2 g via INTRAVENOUS
  Filled 2018-02-19: qty 2

## 2018-02-19 MED ORDER — KETOROLAC TROMETHAMINE 30 MG/ML IJ SOLN
INTRAMUSCULAR | Status: AC
  Administered 2018-02-19: 30 mg via INTRAVENOUS
  Filled 2018-02-19: qty 1

## 2018-02-19 MED ORDER — ROCURONIUM BROMIDE 100 MG/10ML IV SOLN
INTRAVENOUS | Status: DC | PRN
  Administered 2018-02-19: 40 mg via INTRAVENOUS

## 2018-02-19 MED ORDER — ACETAMINOPHEN 10 MG/ML IV SOLN
INTRAVENOUS | Status: AC
  Administered 2018-02-19: 1000 mg via INTRAVENOUS
  Filled 2018-02-19: qty 100

## 2018-02-19 MED ORDER — HYDROMORPHONE HCL 1 MG/ML IJ SOLN: 0.5000 mg | INTRAMUSCULAR | Status: DC | PRN

## 2018-02-19 MED ORDER — ZOLPIDEM TARTRATE 5 MG PO TABS: 5.0000 mg | ORAL_TABLET | Freq: Every evening | ORAL | Status: DC | PRN

## 2018-02-19 MED ORDER — SUGAMMADEX SODIUM 500 MG/5ML IV SOLN
INTRAVENOUS | Status: AC
  Filled 2018-02-19: qty 5

## 2018-02-19 MED ORDER — SUGAMMADEX SODIUM 500 MG/5ML IV SOLN
INTRAVENOUS | Status: DC | PRN
  Administered 2018-02-19: 500 mg via INTRAVENOUS

## 2018-02-19 MED ORDER — BUPIVACAINE-EPINEPHRINE (PF) 0.25% -1:200000 IJ SOLN
INTRAMUSCULAR | Status: AC
  Filled 2018-02-19: qty 30

## 2018-02-19 MED ORDER — DEXAMETHASONE SODIUM PHOSPHATE 10 MG/ML IJ SOLN
INTRAMUSCULAR | Status: AC
  Filled 2018-02-19: qty 1

## 2018-02-19 MED ORDER — DEXAMETHASONE SODIUM PHOSPHATE 10 MG/ML IJ SOLN
INTRAMUSCULAR | Status: DC | PRN
  Administered 2018-02-19: 10 mg via INTRAVENOUS

## 2018-02-19 MED ORDER — LACTATED RINGERS IV SOLN
INTRAVENOUS | Status: DC
  Administered 2018-02-19: 50 mL/h via INTRAVENOUS
  Administered 2018-02-20: 06:00:00 via INTRAVENOUS

## 2018-02-19 MED ORDER — EPHEDRINE SULFATE 50 MG/ML IJ SOLN
INTRAMUSCULAR | Status: DC | PRN
  Administered 2018-02-19: 10 mg via INTRAVENOUS

## 2018-02-19 MED ORDER — LOSARTAN POTASSIUM 25 MG PO TABS
25.0000 mg | ORAL_TABLET | Freq: Every day | ORAL | Status: DC
  Administered 2018-02-20: 25 mg via ORAL
  Filled 2018-02-19: qty 1

## 2018-02-19 MED ORDER — HEPARIN SODIUM (PORCINE) 5000 UNIT/ML IJ SOLN
5000.0000 [IU] | Freq: Three times a day (TID) | INTRAMUSCULAR | Status: DC
  Administered 2018-02-19: 5000 [IU] via SUBCUTANEOUS
  Filled 2018-02-19: qty 1

## 2018-02-19 MED ORDER — MIDAZOLAM HCL 2 MG/2ML IJ SOLN
INTRAMUSCULAR | Status: DC | PRN
  Administered 2018-02-19: 2 mg via INTRAVENOUS

## 2018-02-19 MED ORDER — MIDAZOLAM HCL 2 MG/2ML IJ SOLN
INTRAMUSCULAR | Status: AC
  Filled 2018-02-19: qty 2

## 2018-02-19 MED ORDER — FENTANYL CITRATE (PF) 100 MCG/2ML IJ SOLN
25.0000 ug | INTRAMUSCULAR | Status: DC | PRN
  Administered 2018-02-19 (×3): 25 ug via INTRAVENOUS

## 2018-02-19 MED ORDER — ZINC SULFATE 220 (50 ZN) MG PO CAPS
220.0000 mg | ORAL_CAPSULE | Freq: Every day | ORAL | Status: DC
  Administered 2018-02-20: 220 mg via ORAL
  Filled 2018-02-19 (×2): qty 1

## 2018-02-19 MED ORDER — LIDOCAINE HCL (PF) 2 % IJ SOLN
INTRAMUSCULAR | Status: AC
  Filled 2018-02-19: qty 10

## 2018-02-19 MED ORDER — ONDANSETRON HCL 4 MG/2ML IJ SOLN
INTRAMUSCULAR | Status: AC
  Filled 2018-02-19: qty 2

## 2018-02-19 MED ORDER — OXYCODONE-ACETAMINOPHEN 5-325 MG PO TABS: 1.0000 | ORAL_TABLET | Freq: Four times a day (QID) | ORAL | 0 refills | Status: DC | PRN

## 2018-02-19 MED ORDER — FAMOTIDINE 20 MG PO TABS
20.0000 mg | ORAL_TABLET | Freq: Once | ORAL | Status: AC
  Administered 2018-02-19: 20 mg via ORAL

## 2018-02-19 MED ORDER — ACETAMINOPHEN 10 MG/ML IV SOLN
1000.0000 mg | Freq: Once | INTRAVENOUS | Status: AC
  Administered 2018-02-19: 1000 mg via INTRAVENOUS

## 2018-02-19 MED ORDER — FENTANYL CITRATE (PF) 100 MCG/2ML IJ SOLN
INTRAMUSCULAR | Status: AC
  Administered 2018-02-19: 25 ug via INTRAVENOUS
  Filled 2018-02-19: qty 2

## 2018-02-19 MED ORDER — FAMOTIDINE 20 MG PO TABS
ORAL_TABLET | ORAL | Status: AC
  Administered 2018-02-19: 20 mg via ORAL
  Filled 2018-02-19: qty 1

## 2018-02-19 MED ORDER — ATENOLOL 50 MG PO TABS
50.0000 mg | ORAL_TABLET | ORAL | Status: DC
  Administered 2018-02-20: 50 mg via ORAL
  Filled 2018-02-19: qty 1

## 2018-02-19 MED ORDER — OXYCODONE HCL 5 MG PO TABS
5.0000 mg | ORAL_TABLET | ORAL | Status: DC | PRN
  Administered 2018-02-20 (×3): 5 mg via ORAL
  Filled 2018-02-19 (×3): qty 1

## 2018-02-19 MED ORDER — FENTANYL CITRATE (PF) 100 MCG/2ML IJ SOLN
INTRAMUSCULAR | Status: DC | PRN
  Administered 2018-02-19: 25 ug via INTRAVENOUS
  Administered 2018-02-19: 50 ug via INTRAVENOUS
  Administered 2018-02-19: 25 ug via INTRAVENOUS

## 2018-02-19 MED ORDER — FENTANYL CITRATE (PF) 100 MCG/2ML IJ SOLN
INTRAMUSCULAR | Status: AC
  Filled 2018-02-19: qty 2

## 2018-02-19 MED ORDER — DEXMEDETOMIDINE HCL IN NACL 200 MCG/50ML IV SOLN
INTRAVENOUS | Status: DC | PRN
  Administered 2018-02-19: 24 ug via INTRAVENOUS

## 2018-02-19 MED ORDER — ONDANSETRON HCL 4 MG/2ML IJ SOLN: 4.0000 mg | Freq: Four times a day (QID) | INTRAMUSCULAR | Status: DC | PRN

## 2018-02-19 MED ORDER — BUPIVACAINE-EPINEPHRINE (PF) 0.25% -1:200000 IJ SOLN
INTRAMUSCULAR | Status: DC | PRN
  Administered 2018-02-19: 30 mL

## 2018-02-19 MED ORDER — PROPOFOL 10 MG/ML IV BOLUS
INTRAVENOUS | Status: DC | PRN
  Administered 2018-02-19: 180 mg via INTRAVENOUS

## 2018-02-19 MED ORDER — KETOROLAC TROMETHAMINE 30 MG/ML IJ SOLN
30.0000 mg | Freq: Once | INTRAMUSCULAR | Status: AC | PRN
  Administered 2018-02-19: 30 mg via INTRAVENOUS

## 2018-02-19 MED ORDER — HEPARIN SODIUM (PORCINE) 5000 UNIT/ML IJ SOLN: 5000.0000 [IU] | Freq: Three times a day (TID) | INTRAMUSCULAR | Status: DC

## 2018-02-19 SURGICAL SUPPLY — 49 items
ADH LQ OCL WTPRF AMP STRL LF (MISCELLANEOUS) ×2
ADHESIVE MASTISOL STRL (MISCELLANEOUS) ×3 IMPLANT
APPLIER CLIP ROT 10 11.4 M/L (STAPLE) ×3
APR CLP MED LRG 11.4X10 (STAPLE) ×2
BAG SPEC RTRVL LRG 6X4 10 (ENDOMECHANICALS) ×2
BLADE SURG SZ11 CARB STEEL (BLADE) ×3 IMPLANT
CANISTER SUCT 3000ML PPV (MISCELLANEOUS) ×3 IMPLANT
CHLORAPREP W/TINT 26ML (MISCELLANEOUS) ×3 IMPLANT
CLIP APPLIE ROT 10 11.4 M/L (STAPLE) ×2 IMPLANT
CUTTER FLEX LINEAR 45M (STAPLE) ×3 IMPLANT
DEVICE TROCAR PUNCTURE CLOSURE (ENDOMECHANICALS) ×3 IMPLANT
ELECT REM PT RETURN 9FT ADLT (ELECTROSURGICAL) ×3
ELECTRODE REM PT RTRN 9FT ADLT (ELECTROSURGICAL) ×2 IMPLANT
GLOVE BIO SURGEON STRL SZ8 (GLOVE) ×3 IMPLANT
GOWN STRL REUS W/ TWL LRG LVL3 (GOWN DISPOSABLE) ×4 IMPLANT
GOWN STRL REUS W/TWL LRG LVL3 (GOWN DISPOSABLE) ×6
IRRIGATION STRYKERFLOW (MISCELLANEOUS) ×2 IMPLANT
IRRIGATOR STRYKERFLOW (MISCELLANEOUS) ×3
KIT TURNOVER KIT A (KITS) ×3 IMPLANT
LABEL OR SOLS (LABEL) ×3 IMPLANT
NEEDLE HYPO 22GX1.5 SAFETY (NEEDLE) ×3 IMPLANT
NEEDLE VERESS 14GA 120MM (NEEDLE) ×3 IMPLANT
NS IRRIG 500ML POUR BTL (IV SOLUTION) ×3 IMPLANT
PACK LAP CHOLECYSTECTOMY (MISCELLANEOUS) ×3 IMPLANT
PENCIL ELECTRO HAND CTR (MISCELLANEOUS) ×2 IMPLANT
POUCH SPECIMEN RETRIEVAL 10MM (ENDOMECHANICALS) ×3 IMPLANT
RELOAD 45 VASCULAR/THIN (ENDOMECHANICALS) ×6 IMPLANT
RELOAD STAPLE 45 2.5 WHT GRN (ENDOMECHANICALS) ×1 IMPLANT
RELOAD STAPLE 45 3.5 BLU ETS (ENDOMECHANICALS) ×1 IMPLANT
RELOAD STAPLE TA45 3.5 REG BLU (ENDOMECHANICALS) ×3 IMPLANT
SCISSORS METZENBAUM CVD 33 (INSTRUMENTS) IMPLANT
SLEEVE ENDOPATH XCEL 5M (ENDOMECHANICALS) ×3 IMPLANT
SOL .9 NS 3000ML IRR  AL (IV SOLUTION) ×1
SOL .9 NS 3000ML IRR AL (IV SOLUTION) ×2
SOL .9 NS 3000ML IRR UROMATIC (IV SOLUTION) ×2 IMPLANT
SPONGE GAUZE 2X2 8PLY STRL LF (GAUZE/BANDAGES/DRESSINGS) ×9 IMPLANT
SPONGE LAP 18X18 RF (DISPOSABLE) ×3 IMPLANT
STRIP CLOSURE SKIN 1/2X4 (GAUZE/BANDAGES/DRESSINGS) ×3 IMPLANT
SUT ETHIBOND 0 MO6 C/R (SUTURE) ×2 IMPLANT
SUT MNCRL 4-0 (SUTURE) ×6
SUT MNCRL 4-0 27XMFL (SUTURE) ×4
SUT VIC AB 3-0 SH 27 (SUTURE) ×3
SUT VIC AB 3-0 SH 27X BRD (SUTURE) ×1 IMPLANT
SUT VICRYL 0 TIES 12 18 (SUTURE) ×3 IMPLANT
SUTURE MNCRL 4-0 27XMF (SUTURE) ×3 IMPLANT
TRAY FOLEY MTR SLVR 16FR STAT (SET/KITS/TRAYS/PACK) ×3 IMPLANT
TROCAR XCEL 12X100 BLDLESS (ENDOMECHANICALS) ×3 IMPLANT
TROCAR XCEL NON-BLD 5MMX100MML (ENDOMECHANICALS) ×3 IMPLANT
TUBING INSUFFLATION (TUBING) ×3 IMPLANT

## 2018-02-19 NOTE — Discharge Instructions (Signed)
AMBULATORY SURGERY  DISCHARGE INSTRUCTIONS   1) The drugs that you were given will stay in your system until tomorrow so for the next 24 hours you should not:  A) Drive an automobile B) Make any legal decisions C) Drink any alcoholic beverage   2) You may resume regular meals tomorrow.  Today it is better to start with liquids and gradually work up to solid foods.  You may eat anything you prefer, but it is better to start with liquids, then soup and crackers, and gradually work up to solid foods.   3) Please notify your doctor immediately if you have any unusual bleeding, trouble breathing, redness and pain at the surgery site, drainage, fever, or pain not relieved by medication. 4)   5) Your post-operative visit with Dr.                                     is: Date:                        Time:    Please call to schedule your post-operative visit.  6) Additional Instructions:     Remove dressing in 24 hours. May shower in 24 hours. Leave paper strips in place. Resume all home medications. Follow-up with Dr. Burt Knack in 10 days.

## 2018-02-19 NOTE — Anesthesia Preprocedure Evaluation (Signed)
Anesthesia Evaluation  Patient identified by MRN, date of birth, ID band Patient awake    Reviewed: Allergy & Precautions, H&P , NPO status , Patient's Chart, lab work & pertinent test results  History of Anesthesia Complications Negative for: history of anesthetic complications  Airway Mallampati: III  TM Distance: <3 FB Neck ROM: limited    Dental  (+) Chipped   Pulmonary shortness of breath and with exertion, sleep apnea , COPD,           Cardiovascular Exercise Tolerance: Good hypertension, (-) angina+ CAD, + Past MI and + Peripheral Vascular Disease  + dysrhythmias      Neuro/Psych  Headaches, negative psych ROS   GI/Hepatic negative GI ROS, Neg liver ROS,   Endo/Other  negative endocrine ROS  Renal/GU      Musculoskeletal  (+) Arthritis ,   Abdominal   Peds  Hematology negative hematology ROS (+)   Anesthesia Other Findings Patient has cardiac clearance for this procedure.   Past Medical History: No date: Acute appendicitis No date: Aortic atherosclerosis (HCC) No date: Arthritis No date: Collagen vascular disease (HCC) No date: COPD (chronic obstructive pulmonary disease) (HCC) No date: Dilated aortic root (HCC) No date: Dysrhythmia     Comment:  right bundle branch block...(not new) 02/04/2017: Essential hypertension No date: Fatty liver No date: Headache     Comment:  takes atenolol for this No date: Hyperglycemia No date: Moderate obesity     Comment:  Had been better controlled while he was living in [place]. He gained 30 pounds since returning in January 2018 2016: Myocardial infarction [facility])     Comment:  lower right hand side of heart...no damage done 05/2016: Right bundle branch block (RBBB) on electrocardiogram (ECG) 06/20/2017: Ruptured appendicitis No date: Sleep apnea     Comment:  NO CPAP.  instructed years ago to use. lost weight No date: Thoracic aortic aneurysm  ([place]) 05/2016: Thoracic aortic aneurysm without rupture ([place])     Comment:  a. noted on Cardiac Cath with TAA Angio - Aortic Root               5.1 cm. b. echo 02/2017 - dilated aortic root (92mm) and               ascending aorta (42mm).  Past Surgical History: 1995: BACK SURGERY     Comment:  LOWER 05/2016: CARDIAC CATHETERIZATION     Comment:   ([place], [place]. Dr. Modesto Charon)               05/22/16: EF 55%. Normal coronary arteries. Dilated               aortic root at 5.1 cm. Dual ostia for left coronary               arteries (LAD and LCx). codominant 08/04/2017: COLONOSCOPY WITH PROPOFOL; N/A     Comment:  Procedure: COLONOSCOPY WITH PROPOFOL;  Surgeon: Lin Landsman, MD;  Location: [facility] ENDOSCOPY;  Service:               Gastroenterology;  Laterality: N/A; 01/11/2018: COLONOSCOPY WITH PROPOFOL; N/A     Comment:  Procedure: COLONOSCOPY WITH PROPOFOL;  Surgeon: Lin Landsman, MD;  Location: [facility] ENDOSCOPY;  Service:               Gastroenterology;  Laterality: N/A; 04/2017: ROTATOR CUFF REPAIR; Left  BMI    Body Mass Index:  37.31 kg/m      Reproductive/Obstetrics negative OB ROS                             Anesthesia Physical Anesthesia Plan  ASA: III  Anesthesia Plan: General ETT   Post-op Pain Management:    Induction: Intravenous  PONV Risk Score and Plan: Ondansetron, Dexamethasone, Midazolam and Treatment may vary due to age or medical condition  Airway Management Planned: Oral ETT and Video Laryngoscope Planned  Additional Equipment:   Intra-op Plan:   Post-operative Plan: Extubation in OR  Informed Consent: I have reviewed the patients History and Physical, chart, labs and discussed the procedure including the risks, benefits and alternatives for the proposed anesthesia with the patient or authorized representative who has indicated his/her understanding and acceptance.    Dental Advisory Given  Plan Discussed with: Anesthesiologist, CRNA and Surgeon  Anesthesia Plan Comments: (Patient consented for risks of anesthesia including but not limited to:  - adverse reactions to medications - damage to teeth, lips or other oral mucosa - sore throat or hoarseness - Damage to heart, brain, lungs or loss of life  Patient voiced understanding.)        Anesthesia Quick Evaluation

## 2018-02-19 NOTE — Progress Notes (Signed)
Preoperative Review   Patient is met in the preoperative holding area. The history is reviewed in the chart and with the patient. I personally reviewed the options and rationale as well as the risks of this procedure that have been previously discussed with the patient.  Especially the risk of not resolving his pain due to adhesions.  We also discussed specifically the risk of bleeding infection and conversion to an open procedure.  All questions asked by the patient and/or family were answered to their satisfaction.  Patient agrees to proceed with this procedure at this time.   E  M.D. FACS  

## 2018-02-19 NOTE — Op Note (Signed)
laparascopic appendectomy   Richard Gallagher Date of operation:  02/19/2018  Indications: The patient presented with a history of  abdominal pain. Workup has revealed findings consistent with acute appendicitis.  Pre-operative Diagnosis: History of acute ruptured appendicitis  Post-operative Diagnosis: Same  Procedure: Laparoscopic appendectomy, umbilical hernia repair  Surgeon: Jerrol Banana. Burt Knack, MD, FACS  Anesthesia: General with endotracheal tube  Procedure Details  The patient was seen again in the preop area. The options of surgery versus observation were reviewed with the patient and/or family. The risks of bleeding, infection, recurrence of symptoms, negative laparoscopy, potential for an open procedure, bowel injury, abscess or infection, were all reviewed as well. The patient was taken to Operating Room, identified as Richard Gallagher and the procedure verified as laparoscopic appendectomy. A Time Out was held and the above information confirmed.  The patient was placed in the supine position and general anesthesia was induced.  Antibiotic prophylaxis was administered and VT E prophylaxis was in place. A Foley catheter was placed by the nursing staff.   The abdomen was prepped and draped in a sterile fashion. An infraumbilical incision was made.  Dissection down to the fascia was performed and a small subcentimeter umbilical hernia was reduced and the fascial edges were cleaned.  Figure-of-eight 0 Ethibonds were placed but not tied.  Through the center of the umbilical hernia a 5 mm trocar port was placed bluntly onlly  The abdominal cavity was explored.  Under direct vision a 5 mm suprapubic port was placed and a 13 mm left lateral port was placed all under direct vision.  The appendix was identified and found to be essentially normal with the exception of being dilated distally and scar of fied to a narrow and diminutive size at the appendiceal cecal junction. The appendix was  carefully dissected. The base of the appendix was dissected out and divided with a standard load Endo GIA. The mesoappendix was divided with a vascular load Endo GIA.  2 firings of the vascular load was required.  The staple line was reinforced with clips.  The appendix was passed out through the left lateral port site with the aid of an Endo Catch bag. The right lower quadrant and pelvis was then irrigated with copious amounts of normal saline which was aspirated. Inspection  failed to identify any additional bleeding and there were no signs of bowel injury.   The camera was placed in the left lateral port site to be back at the umbilical hernia site.  There was no sign of bowel injury and no adhesions to the umbilical hernia area.  At this point the left lateral port site was closed under direct vision utilizing an Endo Close technique with 0 Vicryl interrupted sutures, all under direct vision.  The umbilical hernia repair sutures (Ethibonds) were then tied.  Again the right lower quadrant was inspected there was no sign of bleeding or bowel injury therefore pneumoperitoneum was released, all ports were removed and the skin incisions were approximated with subcuticular 4-0 Monocryl.  A 3-0 Vicryl was utilized to tack the skin of the umbilicus back to the fascia.  Steri-Strips and Mastisol and sterile dressings were placed.  The patient tolerated the procedure well, there were no complications. The sponge lap and needle count were correct at the end of the procedure.  The patient was taken to the recovery room in stable condition to be admitted for continued care.  Findings: Narrowed and scar of fied appendiceal base; small subcentimeter umbilical  hernia  Estimated Blood Loss: Minimal                  Specimens: appendix         Complications: None                  Tamari Redwine E. Burt Knack MD, FACS

## 2018-02-19 NOTE — Anesthesia Procedure Notes (Addendum)
Procedure Name: Intubation Date/Time: 02/19/2018 3:39 PM Performed by: Doreen Salvage, CRNA Pre-anesthesia Checklist: Patient identified, Patient being monitored, Timeout performed, Emergency Drugs available and Suction available Patient Re-evaluated:Patient Re-evaluated prior to induction Oxygen Delivery Method: Circle system utilized Preoxygenation: Pre-oxygenation with 100% oxygen Induction Type: IV induction Ventilation: Mask ventilation without difficulty Laryngoscope Size: Mac, 4 and McGraph Grade View: Grade I Tube type: Oral Tube size: 7.5 mm Number of attempts: 1 Airway Equipment and Method: Stylet Placement Confirmation: ETT inserted through vocal cords under direct vision,  positive ETCO2 and breath sounds checked- equal and bilateral Secured at: 23 cm Tube secured with: Tape Dental Injury: Teeth and Oropharynx as per pre-operative assessment

## 2018-02-19 NOTE — Transfer of Care (Signed)
Immediate Anesthesia Transfer of Care Note  Patient: Richard Gallagher  Procedure(s) Performed: Procedure(s): APPENDECTOMY LAPAROSCOPIC (N/A) HERNIA REPAIR UMBILICAL ADULT (N/A)  Patient Location: PACU  Anesthesia Type:General  Level of Consciousness: sedated  Airway & Oxygen Therapy: Patient Spontanous Breathing and Patient connected to face mask oxygen  Post-op Assessment: Report given to RN and Post -op Vital signs reviewed and stable  Post vital signs: Reviewed and stable  Last Vitals:  Vitals:   02/19/18 1418 02/19/18 1658  BP: 136/77 (!) 150/81  Pulse: 61 80  Resp: 15 18  Temp: 36.9 C   SpO2: 58% 72%    Complications: No apparent anesthesia complications

## 2018-02-19 NOTE — Progress Notes (Signed)
Patient requiring supplemental oxygen and is uncomfortable in the recovery room.  As discussed in the office his discharge home would be contingent on his ability to have his pain control, nausea controlled and feel well enough to go home.  Clearly at this point he needs to stay overnight on supplemental oxygen which hopefully can be weaned.  Orders have been placed.

## 2018-02-19 NOTE — Anesthesia Post-op Follow-up Note (Signed)
Anesthesia QCDR form completed.        

## 2018-02-20 DIAGNOSIS — G4730 Sleep apnea, unspecified: Secondary | ICD-10-CM | POA: Diagnosis not present

## 2018-02-20 DIAGNOSIS — K388 Other specified diseases of appendix: Secondary | ICD-10-CM | POA: Diagnosis not present

## 2018-02-20 DIAGNOSIS — I10 Essential (primary) hypertension: Secondary | ICD-10-CM | POA: Diagnosis not present

## 2018-02-20 DIAGNOSIS — I2510 Atherosclerotic heart disease of native coronary artery without angina pectoris: Secondary | ICD-10-CM | POA: Diagnosis not present

## 2018-02-20 DIAGNOSIS — I252 Old myocardial infarction: Secondary | ICD-10-CM | POA: Diagnosis not present

## 2018-02-20 DIAGNOSIS — K429 Umbilical hernia without obstruction or gangrene: Secondary | ICD-10-CM | POA: Diagnosis not present

## 2018-02-20 DIAGNOSIS — J449 Chronic obstructive pulmonary disease, unspecified: Secondary | ICD-10-CM | POA: Diagnosis not present

## 2018-02-20 DIAGNOSIS — E669 Obesity, unspecified: Secondary | ICD-10-CM | POA: Diagnosis not present

## 2018-02-20 DIAGNOSIS — I712 Thoracic aortic aneurysm, without rupture: Secondary | ICD-10-CM | POA: Diagnosis not present

## 2018-02-20 DIAGNOSIS — Z6837 Body mass index (BMI) 37.0-37.9, adult: Secondary | ICD-10-CM | POA: Diagnosis not present

## 2018-02-20 DIAGNOSIS — I739 Peripheral vascular disease, unspecified: Secondary | ICD-10-CM | POA: Diagnosis not present

## 2018-02-20 DIAGNOSIS — Z8719 Personal history of other diseases of the digestive system: Secondary | ICD-10-CM | POA: Diagnosis not present

## 2018-02-20 NOTE — Progress Notes (Signed)
1 Day Post-Op  Subjective: Status post laparoscopic appendectomy and umbilical hernia repair.  Today he is feeling better he has less pain and it is controlled.  His oxygen saturations were not checked overnight on room air.  He appears comfortable.  Objective: Vital signs in last 24 hours: Temp:  [97.5 F (36.4 C)-98.5 F (36.9 C)] 97.9 F (36.6 C) (09/14 0412) Pulse Rate:  [61-81] 68 (09/14 0412) Resp:  [11-26] 16 (09/14 0412) BP: (103-165)/(62-100) 120/75 (09/14 0412) SpO2:  [92 %-96 %] 95 % (09/14 0412) Weight:  [117.9 kg] 117.9 kg (09/13 1418) Last BM Date: 02/19/18  Intake/Output from previous day: 09/13 0701 - 09/14 0700 In: 1000 [I.V.:1000] Out: 200 [Urine:200] Intake/Output this shift: No intake/output data recorded.  Physical exam:  Signs reviewed and stable no sats on room air checked. Wounds are dressed and clean soft nontender abdomen with the exception around the left lower quadrant incision.  Lab Results: CBC  Recent Labs    02/19/18 2006  WBC 13.2*  HGB 15.6  HCT 44.3  PLT 219   BMET Recent Labs    02/19/18 2006  CREATININE 0.89   PT/INR No results for input(s): LABPROT, INR in the last 72 hours. ABG No results for input(s): PHART, HCO3 in the last 72 hours.  Invalid input(s): PCO2, PO2  Studies/Results: No results found.  Anti-infectives: Anti-infectives (From admission, onward)   Start     Dose/Rate Route Frequency Ordered Stop   02/19/18 1415  cefoTEtan (CEFOTAN) 2 g in sodium chloride 0.9 % 100 mL IVPB     2 g 200 mL/hr over 30 Minutes Intravenous  Once 02/19/18 1402 02/19/18 1556      Assessment/Plan: s/p Procedure(s): APPENDECTOMY LAPAROSCOPIC HERNIA REPAIR UMBILICAL ADULT   We will advance diet reevaluate this afternoon once his saturations are checked and he is able to go on room air.  Florene Glen, MD, FACS  02/20/2018

## 2018-02-20 NOTE — Progress Notes (Signed)
Dr. Burt Knack rounded and placed order to discharge home. Discharge instructions, prescriptions and instructions for follow up appointment were reviewed with patient. Questions were encouraged. Will call for wheelchair when patient's transportation arrives.

## 2018-02-20 NOTE — Progress Notes (Signed)
Return to visit patient this afternoon.  He feels much better is not short of breath and has minimal pain.  No nausea or vomiting.  Vital signs are stable and reviewed room air sats are acceptable.  Distended protuberant obese abdomen.  Wounds are dressed   Patient doing very well will discharge this afternoon for follow-up next week.

## 2018-02-22 ENCOUNTER — Encounter: Payer: Self-pay | Admitting: Surgery

## 2018-02-22 ENCOUNTER — Telehealth: Payer: Self-pay | Admitting: *Deleted

## 2018-02-22 NOTE — Telephone Encounter (Signed)
Patient called back and states that his left incision/port site is very painful. He reports if feels like a red hot poker in there every time he tries to get up or walk. The pain started on 02/19/18 after his surgery with burning and has gotten worse since then. He reports the other sites are fine and denies any fever or chills. He reports the pain is 9/10 when he moves but goes away when he is still.  Spoke with Dr Dahlia Byes about the patient and advised to have him be seen by one of our doctors tomorrow. I told the patient this and he said the he cannot walk or get out of the bed. I advised him he could be seen at the ER if unable to walk or get to the office to be seen. He said that he will have them take him to the ER.

## 2018-02-22 NOTE — Anesthesia Postprocedure Evaluation (Signed)
Anesthesia Post Note  Patient: Richard Gallagher  Procedure(s) Performed: APPENDECTOMY LAPAROSCOPIC (N/A Abdomen) HERNIA REPAIR UMBILICAL ADULT (N/A Abdomen)  Patient location during evaluation: PACU Anesthesia Type: General Level of consciousness: awake and alert Pain management: pain level controlled Vital Signs Assessment: post-procedure vital signs reviewed and stable Respiratory status: spontaneous breathing, nonlabored ventilation and respiratory function stable Cardiovascular status: blood pressure returned to baseline and stable Postop Assessment: no apparent nausea or vomiting Anesthetic complications: no     Last Vitals:  Vitals:   02/20/18 1008 02/20/18 1357  BP: (!) 110/55 113/61  Pulse: 75 75  Resp: 17 16  Temp:  36.7 C  SpO2: 95% 91%    Last Pain:  Vitals:   02/20/18 1629  TempSrc:   PainSc: [place]

## 2018-02-22 NOTE — Telephone Encounter (Signed)
Patient wife called in and states her husband was having left leg pain last night. The husband is still a sleep this morning . Advised wife after he gets up to call us back and report how how he is doing this morning. No fever or chills.

## 2018-02-25 ENCOUNTER — Encounter: Payer: Self-pay | Admitting: Surgery

## 2018-02-25 ENCOUNTER — Ambulatory Visit (INDEPENDENT_AMBULATORY_CARE_PROVIDER_SITE_OTHER): Payer: BLUE CROSS/BLUE SHIELD | Admitting: Surgery

## 2018-02-25 ENCOUNTER — Telehealth: Payer: Self-pay

## 2018-02-25 VITALS — BP 160/90 | HR 80 | Temp 98.3°F | Ht 70.0 in | Wt 254.0 lb

## 2018-02-25 DIAGNOSIS — K3580 Unspecified acute appendicitis: Secondary | ICD-10-CM

## 2018-02-25 LAB — SURGICAL PATHOLOGY

## 2018-02-25 MED ORDER — OXYCODONE-ACETAMINOPHEN 5-325 MG PO TABS: 1.0000 | ORAL_TABLET | ORAL | 0 refills | Status: DC | PRN

## 2018-02-25 NOTE — Telephone Encounter (Signed)
Dr. Burt Knack wanted me to call pathology department for result for one of his patients. I was told that it was under review by the pathologist, therefore, hopefully it would be ready later on today. I was told that once it was ready, that they would give me a call.

## 2018-02-25 NOTE — Telephone Encounter (Signed)
Pathology report is able to be read.

## 2018-02-25 NOTE — Progress Notes (Signed)
Outpatient postop visit  02/25/2018  Richard Gallagher is an 62 y.o. male.    Procedure: Interval laparoscopic appendectomy  CC: Left lower quadrant pain HPI: This patient status post laparoscopic appendectomy for a history of ruptured appendix back in January.  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  Patient complains of pain in general but mostly in the left lower quadrant near the incision.  This was an elective interval appendectomy but the patient had been complaining of right lower quadrant and right flank pain which was presumed to be due to the ruptured appendix and scar tissue.  I reminded him that this type of pain may not be resolved by the interval appendectomy.  He also has known polyps in his right colon which have been removed and showed no sign of dysplasia or precancerous tendencies.  Tattooing was noted at the time of surgery.  Medications reviewed.    Physical Exam:  There were no vitals taken for this visit.    PE: Morbidly obese male patient, walking slowly but stable.   Vital signs are stable and afebrile  Wounds are clean no erythema no drainage soft nontender abdomen. Left lower quadrant incision and umbilical hernia incisions are healing well    Assessment/Plan:  Pathology is available this morning and reviewed.  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  No sign of malignancy.  Patient is status post interval appendectomy performed laparoscopically.  Pathology did not show any sign of malignancy. Patient doing quite well at this time.  He could go back to work once he is able to climb in and out of his truck but cannot be on narcotics at that time.  He is asked for refill of his narcotics for short period and could return to work once he is off of those.  He should not do any heavy lifting for a total 6 weeks from the time of surgery.   We will follow-up with [place] on an as-needed basis. Florene Glen, MD, FACS

## 2018-02-25 NOTE — Patient Instructions (Signed)

## 2018-03-03 DIAGNOSIS — M75122 Complete rotator cuff tear or rupture of left shoulder, not specified as traumatic: Secondary | ICD-10-CM | POA: Diagnosis not present

## 2018-03-10 DIAGNOSIS — R0602 Shortness of breath: Secondary | ICD-10-CM | POA: Diagnosis not present

## 2018-03-10 DIAGNOSIS — I10 Essential (primary) hypertension: Secondary | ICD-10-CM | POA: Diagnosis not present

## 2018-03-10 DIAGNOSIS — I77810 Thoracic aortic ectasia: Secondary | ICD-10-CM | POA: Diagnosis not present

## 2018-03-10 DIAGNOSIS — I700 Atherosclerosis of aorta: Secondary | ICD-10-CM | POA: Diagnosis not present

## 2018-03-10 DIAGNOSIS — Z23 Encounter for immunization: Secondary | ICD-10-CM | POA: Diagnosis not present

## 2018-03-12 ENCOUNTER — Telehealth: Payer: Self-pay

## 2018-03-12 NOTE — Telephone Encounter (Signed)
Patient's wife states her husband went to his PCP for coughing. 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 She states he is having some abdominal pain near his incisions.   He is requesting a revision of work note however I let her know we would need to see the patient. Patient added to schedule 03/16/18 with Dr.Davis.

## 2018-03-16 ENCOUNTER — Encounter: Payer: Self-pay | Admitting: Surgery

## 2018-03-16 ENCOUNTER — Ambulatory Visit (INDEPENDENT_AMBULATORY_CARE_PROVIDER_SITE_OTHER): Payer: BLUE CROSS/BLUE SHIELD | Admitting: Surgery

## 2018-03-16 VITALS — BP 136/90 | HR 76 | Temp 97.5°F | Ht 70.0 in | Wt 255.6 lb

## 2018-03-16 DIAGNOSIS — Z4889 Encounter for other specified surgical aftercare: Secondary | ICD-10-CM

## 2018-03-16 NOTE — Progress Notes (Signed)
Surgical Clinic Progress/Follow-up Note   HPI:  62 y.o. Male presents to clinic for subsequent post-op follow-up 3 weeks s/p laparoscopic interval appendectomy Richard Gallagher, 02/25/2018) to request extension of his time out of work from 3 weeks to 4 weeks due to improving, but unresolved/ongoing, LLQ abdominal pain. Patient reports he has otherwise been tolerating regular diet with +flatus and normal BM's, denies N/V, fever/chills, CP, or SOB.  Review of Systems:  Constitutional: denies fever/chills  Respiratory: denies shortness of breath, wheezing  Cardiovascular: denies chest pain, palpitations  Gastrointestinal: abdominal pain, N/V, and bowel function as per interval history Skin: Denies any other rashes or skin discolorations except post-surgical wounds as per interval history  Vital Signs:  BP 136/90   Pulse 76   Temp (!) 97.5 F (36.4 C) (Temporal)   Ht 5' 10"  (1.778 m)   Wt 255 lb 9.6 oz (115.9 kg)   BMI 36.67 kg/m    Physical Exam:  Constitutional:  -- Obese body habitus  -- Awake, alert, and oriented x3  Pulmonary:  -- No crackles -- Equal breath sounds bilaterally -- Breathing non-labored at rest Cardiovascular:  -- S1, S2 present  -- No pericardial rubs  Gastrointestinal:  -- Soft and non-distended with mild LLQ peri-incisional tenderness to palpation, no guarding/rebound tenderness -- Post-surgical incisions all well-approximated without any peri-incisional erythema or drainage -- No abdominal masses appreciated, pulsatile or otherwise  Musculoskeletal / Integumentary:  -- Wounds or skin discoloration: None appreciated except post-surgical incisions as described above (GI) -- Extremities: B/L UE and LE FROM, hands and feet warm   Assessment:  62 y.o. yo Male with a problem list including...  Patient Active Problem List   Diagnosis Date Noted  . Chronic appendicitis 02/19/2018  . Other appendicitis   . Umbilical hernia without obstruction and without gangrene    . Tubular adenoma of colon   . Terminal ileitis of small intestine, other complication ([place]) 83/[id number]/[id number]  . Abdominal pain, RLQ   . Ruptured appendicitis 06/20/2017  . Pre-operative cardiovascular examination 02/04/2017  . DOE (dyspnea on exertion) 02/04/2017  . Thoracic aortic aneurysm without rupture ([place]) 02/04/2017  . Essential hypertension 02/04/2017    presents to clinic for subsequent post-op follow-up 3 weeks s/p laparoscopic interval appendectomy Richard Gallagher, 02/25/2018) to request extension of his time out of work from 3 weeks to 4 weeks, doing overall well despite improving, but unresolved/ongoing, LLQ abdominal pain.  Plan:              - advance diet as tolerated              - okay to submerge incisions under water (baths, swimming) prn             - typically recommended is no heavy lifting x 4 weeks from date of surgery, after which may gradually resume all activities             - apply sunblock particularly to incisions with sun exposure to reduce pigmentation of scars  - weight loss encouraged, bariatric medical and surgical program info provided             - return to clinic as needed, instructed to call office if any questions or concerns  All of the above recommendations were discussed with the patient, and all of patient's questions were answered to his expressed satisfaction.  -- Marilynne Drivers Rosana Hoes, MD, [place]: [facility] - Partnering for exceptional care. Office: [phone]

## 2018-03-16 NOTE — Patient Instructions (Signed)
Please call our office if you have questions or concerns.   

## 2018-03-22 ENCOUNTER — Ambulatory Visit: Payer: BLUE CROSS/BLUE SHIELD | Admitting: Pulmonary Disease

## 2018-03-22 ENCOUNTER — Other Ambulatory Visit (INDEPENDENT_AMBULATORY_CARE_PROVIDER_SITE_OTHER): Payer: BLUE CROSS/BLUE SHIELD

## 2018-03-22 ENCOUNTER — Encounter: Payer: Self-pay | Admitting: Pulmonary Disease

## 2018-03-22 ENCOUNTER — Ambulatory Visit (INDEPENDENT_AMBULATORY_CARE_PROVIDER_SITE_OTHER)
Admission: RE | Admit: 2018-03-22 | Discharge: 2018-03-22 | Disposition: A | Discharge status: Home / Self Care | Payer: BLUE CROSS/BLUE SHIELD | Source: Ambulatory Visit | Attending: Pulmonary Disease | Admitting: Pulmonary Disease

## 2018-03-22 VITALS — BP 138/70 | HR 74 | Ht 70.0 in | Wt 261.8 lb

## 2018-03-22 DIAGNOSIS — R0602 Shortness of breath: Secondary | ICD-10-CM

## 2018-03-22 DIAGNOSIS — J9811 Atelectasis: Secondary | ICD-10-CM | POA: Diagnosis not present

## 2018-03-22 LAB — CBC WITH DIFFERENTIAL/PLATELET
BASOS ABS: 0.1 10*3/uL (ref 0.0–0.1)
Basophils Relative: 0.7 % (ref 0.0–3.0)
Eosinophils Absolute: 0.4 10*3/uL (ref 0.0–0.7)
Eosinophils Relative: 3.6 % (ref 0.0–5.0)
HCT: 46.2 % (ref 39.0–52.0)
Hemoglobin: 15.5 g/dL (ref 13.0–17.0)
LYMPHS ABS: 1.4 10*3/uL (ref 0.7–4.0)
Lymphocytes Relative: 11.2 % — ABNORMAL LOW (ref 12.0–46.0)
MCHC: 33.6 g/dL (ref 30.0–36.0)
MCV: 84.2 fl (ref 78.0–100.0)
MONO ABS: 1.4 10*3/uL — AB (ref 0.1–1.0)
Monocytes Relative: 11.4 % (ref 3.0–12.0)
NEUTROS ABS: 9 10*3/uL — AB (ref 1.4–7.7)
NEUTROS PCT: 73.1 % (ref 43.0–77.0)
PLATELETS: 265 10*3/uL (ref 150.0–400.0)
RBC: 5.49 Mil/uL (ref 4.22–5.81)
RDW: 13.9 % (ref 11.5–15.5)
WBC: 12.3 10*3/uL — ABNORMAL HIGH (ref 4.0–10.5)

## 2018-03-22 LAB — NITRIC OXIDE: NITRIC OXIDE: 19

## 2018-03-22 MED ORDER — PANTOPRAZOLE SODIUM 40 MG PO TBEC: 40.0000 mg | DELAYED_RELEASE_TABLET | Freq: Two times a day (BID) | ORAL | 1 refills | Status: DC

## 2018-03-22 MED ORDER — FLUTICASONE FUROATE-VILANTEROL 200-25 MCG/INH IN AEPB: 1.0000 | INHALATION_SPRAY | Freq: Every day | RESPIRATORY_TRACT | 5 refills | Status: AC

## 2018-03-22 NOTE — Patient Instructions (Signed)
We will check a chest x-ray, CBC differential, IgE levels Schedule you for pulmonary function test and split night sleep study Start you on Breo 200 inhaler and Protonix 40 mg twice daily  Follow-up in 2 to 4 weeks.
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
## 2018-03-23 ENCOUNTER — Telehealth: Payer: Self-pay | Admitting: Pulmonary Disease

## 2018-03-23 LAB — IGE: IgE (Immunoglobulin E), Serum: 36 kU/L (ref <=114)

## 2018-03-23 NOTE — Telephone Encounter (Signed)
Spoke with Cabin crew at [facility] in [place], [place]. States that insurance does not want to cover Protonix BID. They will cover one capsule daily.  I called and spoke with pt's wife and made her aware of this.  Dr. Vaughan Browner - please advise if you are okay with changing the prescription or if we need to initiate a PA. Thanks.

## 2018-03-24 MED ORDER — PANTOPRAZOLE SODIUM 40 MG PO TBEC: 40.0000 mg | DELAYED_RELEASE_TABLET | Freq: Every day | ORAL | 1 refills | Status: DC

## 2018-03-24 NOTE — Telephone Encounter (Signed)
Called and spoke with pt's wife [name] letting her know that Dr. Vaughan Browner said it was okay for pt to take the pantoprazole once daily.  [name] expressed understanding. I sent a new Rx to pt's preferred pharmacy with the new instructions of taking it once daily and called the pharmacy and spoke with Whitney letting her know to cancel the original Rx which stated bid and also let her know that I was sending a new Rx electronically to be filled for pt.  Whitney expressed understanding. Nothing further needed.

## 2018-03-24 NOTE — Telephone Encounter (Signed)
Ok to prescribe protonix 40 mg once daily

## 2018-04-09 ENCOUNTER — Ambulatory Visit (INDEPENDENT_AMBULATORY_CARE_PROVIDER_SITE_OTHER): Payer: BLUE CROSS/BLUE SHIELD | Admitting: Pulmonary Disease

## 2018-04-09 ENCOUNTER — Encounter: Payer: Self-pay | Admitting: Pulmonary Disease

## 2018-04-09 ENCOUNTER — Other Ambulatory Visit (INDEPENDENT_AMBULATORY_CARE_PROVIDER_SITE_OTHER): Payer: BLUE CROSS/BLUE SHIELD

## 2018-04-09 ENCOUNTER — Ambulatory Visit: Payer: BLUE CROSS/BLUE SHIELD | Admitting: Pulmonary Disease

## 2018-04-09 DIAGNOSIS — R0602 Shortness of breath: Secondary | ICD-10-CM | POA: Diagnosis not present

## 2018-04-09 LAB — PULMONARY FUNCTION TEST
FEF 25-75 PRE: 1.9 L/s
FEF 25-75 Post: 2.57 L/sec
FEF2575-%Change-Post: 35 %
FEF2575-%PRED-PRE: 78 %
FEF2575-%Pred-Post: 106 %
FEV1-%Change-Post: 5 %
FEV1-%Pred-Post: 94 %
FEV1-%Pred-Pre: 89 %
FEV1-PRE: 2.65 L
FEV1-Post: 2.79 L
FEV1FVC-%Change-Post: 4 %
FEV1FVC-%Pred-Pre: 100 %
FEV6-%CHANGE-POST: 0 %
FEV6-%PRED-POST: 94 %
FEV6-%PRED-PRE: 93 %
FEV6-PRE: 3.5 L
FEV6-Post: 3.53 L
FEV6FVC-%Pred-Post: 105 %
FEV6FVC-%Pred-Pre: 105 %
FVC-%CHANGE-POST: 0 %
FVC-%PRED-POST: 89 %
FVC-%Pred-Pre: 88 %
FVC-Post: 3.53 L
FVC-Pre: 3.5 L
POST FEV6/FVC RATIO: 100 %
PRE FEV1/FVC RATIO: 76 %
Post FEV1/FVC ratio: 79 %
Pre FEV6/FVC Ratio: 100 %
RV % pred: 112 %
RV: 2.31 L
TLC % PRED: 140 %
TLC: 8.56 L

## 2018-04-09 LAB — BASIC METABOLIC PANEL
BUN: 14 mg/dL (ref 6–23)
CALCIUM: 9.3 mg/dL (ref 8.4–10.5)
CO2: 28 meq/L (ref 19–32)
CREATININE: 0.88 mg/dL (ref 0.40–1.50)
Chloride: 101 mEq/L (ref 96–112)
GFR: 92.92 mL/min (ref >60.00)
GLUCOSE: 154 mg/dL — AB (ref 70–99)
Potassium: 3.6 mEq/L (ref 3.5–5.1)
Sodium: 140 mEq/L (ref 135–145)

## 2018-04-09 MED ORDER — CHLORPHENIRAMINE MALEATE 4 MG PO TABS: 8.0000 mg | ORAL_TABLET | Freq: Three times a day (TID) | ORAL | 0 refills | Status: DC

## 2018-04-09 MED ORDER — AZELASTINE-FLUTICASONE 137-50 MCG/ACT NA SUSP: 2.0000 | Freq: Every day | NASAL | 1 refills | Status: DC

## 2018-04-09 MED ORDER — PREDNISONE 10 MG PO TABS: ORAL_TABLET | ORAL | 0 refills | Status: DC

## 2018-04-09 NOTE — Patient Instructions (Addendum)
We will start prednisone 40 mg a day.  Reduce dose by 10 mg every 3 days Continue the Breo.  Use the Protonix twice daily  We will give her a prescription for chlorphentermine 8 mg 3 times daily and Dymista nasal spray We will order a CTA to evaluate your ongoing issues with dyspnea  Follow-up in 1 month.
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
## 2018-04-09 NOTE — Progress Notes (Signed)
PFT completed today.  

## 2018-04-15 ENCOUNTER — Encounter: Payer: Self-pay | Admitting: Surgery

## 2018-04-15 ENCOUNTER — Ambulatory Visit (INDEPENDENT_AMBULATORY_CARE_PROVIDER_SITE_OTHER): Payer: BLUE CROSS/BLUE SHIELD | Admitting: Surgery

## 2018-04-15 ENCOUNTER — Other Ambulatory Visit: Payer: Self-pay

## 2018-04-15 VITALS — BP 136/78 | HR 89 | Temp 97.7°F | Ht 66.0 in | Wt 264.2 lb

## 2018-04-15 DIAGNOSIS — R109 Unspecified abdominal pain: Secondary | ICD-10-CM
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
## 2018-04-15 NOTE — Patient Instructions (Signed)
Please call our office if you have questions or concerns.   

## 2018-04-18 ENCOUNTER — Encounter: Payer: Self-pay | Admitting: Surgery

## 2018-04-23 ENCOUNTER — Ambulatory Visit
Admission: RE | Admit: 2018-04-23 | Discharge: 2018-04-23 | Disposition: A | Discharge status: Home / Self Care | Payer: BLUE CROSS/BLUE SHIELD | Source: Ambulatory Visit | Attending: Pulmonary Disease | Admitting: Pulmonary Disease

## 2018-04-23 DIAGNOSIS — R0602 Shortness of breath: Secondary | ICD-10-CM

## 2018-04-23 DIAGNOSIS — I700 Atherosclerosis of aorta: Secondary | ICD-10-CM | POA: Insufficient documentation

## 2018-04-23 DIAGNOSIS — J9811 Atelectasis: Secondary | ICD-10-CM | POA: Diagnosis not present

## 2018-04-23 MED ORDER — IOPAMIDOL (ISOVUE-370) INJECTION 76%
75.0000 mL | Freq: Once | INTRAVENOUS | Status: AC | PRN
  Administered 2018-04-23: 75 mL via INTRAVENOUS

## 2018-04-28 ENCOUNTER — Ambulatory Visit (HOSPITAL_BASED_OUTPATIENT_CLINIC_OR_DEPARTMENT_OTHER): Payer: BLUE CROSS/BLUE SHIELD | Attending: Pulmonary Disease | Admitting: Pulmonary Disease

## 2018-04-28 VITALS — Ht 67.0 in | Wt 260.0 lb

## 2018-04-28 DIAGNOSIS — R0602 Shortness of breath: Secondary | ICD-10-CM | POA: Diagnosis not present

## 2018-04-28 DIAGNOSIS — G4733 Obstructive sleep apnea (adult) (pediatric): Secondary | ICD-10-CM | POA: Diagnosis not present

## 2018-04-28 NOTE — Progress Notes (Signed)
ATC, NA 

## 2018-05-12 ENCOUNTER — Telehealth: Payer: Self-pay

## 2018-05-12 DIAGNOSIS — R0602 Shortness of breath: Secondary | ICD-10-CM

## 2018-05-12 NOTE — Telephone Encounter (Signed)
ATC pt on both number's on file- received message that number I had dialed was not in service.  Will call back

## 2018-05-12 NOTE — Telephone Encounter (Signed)
-----   Message from Marshell Garfinkel, MD sent at 05/12/2018 11:10 AM EST ----- Please let patient know he has severe sleep apnea Order CPAP therapy on 18 cm H2O with a Medium Wide size Philips Respironics Full Face Mask Dreamwear mask and heated humidification.  ----- Message ----- From: Chesley Mires, MD Sent: 05/12/2018   8:37 AM EST To: Marshell Garfinkel, MD

## 2018-05-12 NOTE — Procedures (Signed)
    Patient Name: Richard Gallagher, Richard Gallagher Date: 04/28/2018   Gender: Male  D.O.B: 1955/09/01  Age (years): 62  Referring Provider: Marshell Garfinkel  Height (inches): 29  Interpreting Physician: Chesley Mires MD, ABSM  Weight (lbs): 260  RPSGT: Lanae Boast  BMI: 41  MRN: [id number]  Neck Size: 19.50  <br> <br>  CLINICAL INFORMATION  Sleep Study Type: Split Night CPAP Indication for sleep study: 62 year old male with snoring, sleep disruption, and daytime sleepiness. Epworth Sleepiness Score: 6 SLEEP STUDY TECHNIQUE  As per the AASM Manual for the Scoring of Sleep and Associated Events v2.3 (April 2016) with a hypopnea requiring 4% desaturations. The channels recorded and monitored were frontal, central and occipital EEG, electrooculogram (EOG), submentalis EMG (chin), nasal and oral airflow, thoracic and abdominal wall motion, anterior tibialis EMG, snore microphone, electrocardiogram, and pulse oximetry. Continuous positive airway pressure (CPAP) was initiated when the patient met split night criteria and was titrated according to treat sleep-disordered breathing. MEDICATIONS  Medications self-administered by patient taken the night of the study : N/A RESPIRATORY PARAMETERS  Diagnostic Total AHI (/hr): 100.6 RDI (/hr): 100.6 OA Index (/hr): 80.9 CA Index (/hr): 0.0  REM AHI (/hr): 80.0 NREM AHI (/hr): 105.2 Supine AHI (/hr): 100.6 Non-supine AHI (/hr): 0  Min O2 Sat (%): 68.0 Mean O2 (%): 85.2 Time below 88% (min): 129.2      Titration Optimal Pressure (cm): 18 AHI at Optimal Pressure (/hr): 1.1 Min O2 at Optimal Pressure (%): 87.0  Supine % at Optimal (%): 100 Sleep % at Optimal (%): 100      SLEEP ARCHITECTURE  The recording time for the entire night was 415.1 minutes. During a baseline period of 192.5 minutes, the patient slept for 170.5 minutes in REM and nonREM, yielding a sleep efficiency of 88.6%%. Sleep onset after lights out was 2.4 minutes with a REM latency  of 94.0 minutes. The patient spent 44.0%% of the night in stage N1 sleep, 39.3%% in stage N2 sleep, 0.0%% in stage N3 and 16.7% in REM. During the titration period of 214.4 minutes, the patient slept for 210.6 minutes in REM and nonREM, yielding a sleep efficiency of 98.2%%. Sleep onset after CPAP initiation was 1.8 minutes with a REM latency of 66.0 minutes. The patient spent 12.6%% of the night in stage N1 sleep, 27.5%% in stage N2 sleep, 0.0%% in stage N3 and 59.9% in REM. CARDIAC DATA  The 2 lead EKG demonstrated sinus rhythm. The mean heart rate was 100.0 beats per minute. Other EKG findings include: PVCs.  LEG MOVEMENT DATA  The total Periodic Limb Movements of Sleep (PLMS) were 0. The PLMS index was 0.0 . IMPRESSIONS  - Severe obstructive sleep apnea with an AHI of 100.6 and SpO2 low of 68%. - He did well with CPAP 18 cm H2O. He was observed in REM and supine sleep with this pressure setting. - He did not require the use of supplemental oxygen during this study. DIAGNOSIS  - Obstructive Sleep Apnea (327.23 [G47.33 ICD-10]) RECOMMENDATIONS  - Trial of CPAP therapy on 18 cm H2O with a Medium Wide size Philips Respironics Full Face Mask Dreamwear mask and heated humidification. [Electronically signed] 05/12/2018 08:36 AM Chesley Mires MD, ABSM  Diplomate, American Board of Sleep Medicine  NPI: [id number]

## 2018-05-13 ENCOUNTER — Encounter: Payer: Self-pay | Admitting: Pulmonary Disease

## 2018-05-13 ENCOUNTER — Ambulatory Visit (INDEPENDENT_AMBULATORY_CARE_PROVIDER_SITE_OTHER): Payer: BLUE CROSS/BLUE SHIELD | Admitting: Pulmonary Disease

## 2018-05-13 VITALS — BP 134/72 | HR 86 | Ht 67.0 in | Wt 264.0 lb

## 2018-05-13 DIAGNOSIS — G4733 Obstructive sleep apnea (adult) (pediatric): Secondary | ICD-10-CM

## 2018-05-13 DIAGNOSIS — R0602 Shortness of breath: Secondary | ICD-10-CM | POA: Diagnosis not present

## 2018-05-13 DIAGNOSIS — R911 Solitary pulmonary nodule: Secondary | ICD-10-CM

## 2018-05-13 NOTE — Patient Instructions (Addendum)
I have reviewed your CT scan which shows small pulmonary nodules and aortic aneurysm. We will follow-up with CTA with contrast in 1 year  Your sleep study shows severe sleep apnea with low oxygen levels We will start you on CPAP for this  Follow-up in 3 months.

## 2018-05-13 NOTE — Telephone Encounter (Addendum)
Pt seen for OV on 05/13/18- results were given to patient.  Nothing further is needed.
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
## 2018-05-26 DIAGNOSIS — G4733 Obstructive sleep apnea (adult) (pediatric): Secondary | ICD-10-CM | POA: Diagnosis not present

## 2018-06-26 DIAGNOSIS — G4733 Obstructive sleep apnea (adult) (pediatric): Secondary | ICD-10-CM | POA: Diagnosis not present

## 2018-07-09 ENCOUNTER — Other Ambulatory Visit: Payer: Self-pay | Admitting: Orthopedic Surgery

## 2018-07-09 DIAGNOSIS — M75122 Complete rotator cuff tear or rupture of left shoulder, not specified as traumatic: Secondary | ICD-10-CM | POA: Diagnosis not present

## 2018-07-09 DIAGNOSIS — M75102 Unspecified rotator cuff tear or rupture of left shoulder, not specified as traumatic: Secondary | ICD-10-CM

## 2018-07-15 ENCOUNTER — Ambulatory Visit
Admission: RE | Admit: 2018-07-15 | Discharge: 2018-07-15 | Disposition: A | Discharge status: Home / Self Care | Payer: BLUE CROSS/BLUE SHIELD | Source: Ambulatory Visit | Attending: Orthopedic Surgery | Admitting: Orthopedic Surgery

## 2018-07-15 DIAGNOSIS — M25512 Pain in left shoulder: Secondary | ICD-10-CM | POA: Diagnosis not present

## 2018-07-15 DIAGNOSIS — M75102 Unspecified rotator cuff tear or rupture of left shoulder, not specified as traumatic: Secondary | ICD-10-CM

## 2018-07-15 DIAGNOSIS — S46012A Strain of muscle(s) and tendon(s) of the rotator cuff of left shoulder, initial encounter: Secondary | ICD-10-CM | POA: Diagnosis not present

## 2018-07-15 MED ORDER — IOPAMIDOL (ISOVUE-M 200) INJECTION 41%
15.0000 mL | Freq: Once | INTRAMUSCULAR | Status: AC
  Administered 2018-07-15: 15 mL via INTRA_ARTICULAR

## 2018-07-19 DIAGNOSIS — M75122 Complete rotator cuff tear or rupture of left shoulder, not specified as traumatic: Secondary | ICD-10-CM | POA: Diagnosis not present

## 2018-07-19 DIAGNOSIS — M25512 Pain in left shoulder: Secondary | ICD-10-CM | POA: Diagnosis not present

## 2018-07-27 DIAGNOSIS — G4733 Obstructive sleep apnea (adult) (pediatric): Secondary | ICD-10-CM | POA: Diagnosis not present

## 2018-08-16 ENCOUNTER — Ambulatory Visit: Payer: BLUE CROSS/BLUE SHIELD | Admitting: Pulmonary Disease

## 2018-08-16 ENCOUNTER — Encounter: Payer: Self-pay | Admitting: Pulmonary Disease

## 2018-08-16 VITALS — BP 136/72 | HR 93 | Ht 67.0 in | Wt 266.8 lb

## 2018-08-16 DIAGNOSIS — G4733 Obstructive sleep apnea (adult) (pediatric): Secondary | ICD-10-CM | POA: Diagnosis not present

## 2018-08-16 DIAGNOSIS — M25512 Pain in left shoulder: Secondary | ICD-10-CM | POA: Diagnosis not present

## 2018-08-16 DIAGNOSIS — R0602 Shortness of breath: Secondary | ICD-10-CM

## 2018-08-16 DIAGNOSIS — M75122 Complete rotator cuff tear or rupture of left shoulder, not specified as traumatic: Secondary | ICD-10-CM | POA: Diagnosis not present

## 2018-08-16 NOTE — Patient Instructions (Signed)
I am glad you are doing well I have reviewed download from your CPAP machine.  It appears you are getting good pressures and good control of your sleep apnea  Follow-up in 6 months.
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
## 2018-08-25 DIAGNOSIS — G4733 Obstructive sleep apnea (adult) (pediatric): Secondary | ICD-10-CM | POA: Diagnosis not present

## 2018-09-09 DIAGNOSIS — G4700 Insomnia, unspecified: Secondary | ICD-10-CM | POA: Diagnosis not present

## 2018-09-09 DIAGNOSIS — E668 Other obesity: Secondary | ICD-10-CM | POA: Diagnosis not present

## 2018-09-09 DIAGNOSIS — I700 Atherosclerosis of aorta: Secondary | ICD-10-CM | POA: Diagnosis not present

## 2018-09-09 DIAGNOSIS — I10 Essential (primary) hypertension: Secondary | ICD-10-CM | POA: Diagnosis not present

## 2018-09-25 DIAGNOSIS — G4733 Obstructive sleep apnea (adult) (pediatric): Secondary | ICD-10-CM | POA: Diagnosis not present

## 2018-10-25 DIAGNOSIS — G4733 Obstructive sleep apnea (adult) (pediatric): Secondary | ICD-10-CM | POA: Diagnosis not present

## 2018-11-10 ENCOUNTER — Telehealth: Payer: Self-pay | Admitting: Pulmonary Disease

## 2018-11-10 NOTE — Telephone Encounter (Signed)
Cpap download printed off airview showing patient's cpap compliance faxed to [facility], attnJeanette Caprice, [phone].  Fax confirmation received.  Message left on Richard Gallagher's VM at [facility] to let her know fax had been sent to requested number. Nothing further at this time.

## 2018-11-15 ENCOUNTER — Emergency Department (HOSPITAL_COMMUNITY): Payer: BC Managed Care – PPO

## 2018-11-15 ENCOUNTER — Encounter (HOSPITAL_COMMUNITY): Payer: Self-pay | Admitting: Emergency Medicine

## 2018-11-15 ENCOUNTER — Emergency Department (HOSPITAL_COMMUNITY)
Admission: EM | Admit: 2018-11-15 | Discharge: 2018-11-15 | Disposition: A | Discharge status: Home / Self Care | Payer: BC Managed Care – PPO | Attending: Emergency Medicine | Admitting: Emergency Medicine

## 2018-11-15 ENCOUNTER — Other Ambulatory Visit: Payer: Self-pay

## 2018-11-15 DIAGNOSIS — R509 Fever, unspecified: Secondary | ICD-10-CM | POA: Diagnosis not present

## 2018-11-15 DIAGNOSIS — R05 Cough: Secondary | ICD-10-CM | POA: Insufficient documentation

## 2018-11-15 DIAGNOSIS — R0602 Shortness of breath: Secondary | ICD-10-CM | POA: Insufficient documentation

## 2018-11-15 DIAGNOSIS — Z5321 Procedure and treatment not carried out due to patient leaving prior to being seen by health care provider: Secondary | ICD-10-CM | POA: Diagnosis not present

## 2018-11-15 LAB — BASIC METABOLIC PANEL
Anion gap: 12 (ref 5–15)
BUN: 14 mg/dL (ref 8–23)
CO2: 19 mmol/L — ABNORMAL LOW (ref 22–32)
Calcium: 8.3 mg/dL — ABNORMAL LOW (ref 8.9–10.3)
Chloride: 107 mmol/L (ref 98–111)
Creatinine, Ser: 0.86 mg/dL (ref 0.61–1.24)
GFR calc Af Amer: >60 mL/min (ref >60)
GFR calc non Af Amer: >60 mL/min (ref >60)
Glucose, Bld: 95 mg/dL (ref 70–99)
Potassium: 3.3 mmol/L — ABNORMAL LOW (ref 3.5–5.1)
Sodium: 138 mmol/L (ref 135–145)

## 2018-11-15 LAB — CBC
HCT: 49.6 % (ref 39.0–52.0)
Hemoglobin: 16.3 g/dL (ref 13.0–17.0)
MCH: 28.3 pg (ref 26.0–34.0)
MCHC: 32.9 g/dL (ref 30.0–36.0)
MCV: 86.3 fL (ref 80.0–100.0)
Platelets: 151 10*3/uL (ref 150–400)
RBC: 5.75 MIL/uL (ref 4.22–5.81)
RDW: 13.6 % (ref 11.5–15.5)
WBC: 5.3 10*3/uL (ref 4.0–10.5)
nRBC: 0 % (ref 0.0–0.2)

## 2018-11-15 LAB — LACTIC ACID, PLASMA: Lactic Acid, Venous: 1.3 mmol/L (ref 0.5–1.9)

## 2018-11-15 LAB — TROPONIN I: Troponin I: <0.03 ng/mL (ref <0.03)

## 2018-11-15 NOTE — ED Triage Notes (Signed)
Pt reports went [facility] for a wedding on Thursday. Started having cough, SOB, fevers on Friday. hasnt taken anything for fevers today. Reports cough nothing comes out, just feels like gets stuck in throat. Reports with his age and all his health problems doesn't want to die at home.

## 2018-11-15 NOTE — ED Notes (Signed)
Bed: WA12 Expected date:  Expected time:  Means of arrival:  Comments: Negative Pressure 

## 2018-11-17 DIAGNOSIS — Z20828 Contact with and (suspected) exposure to other viral communicable diseases: Secondary | ICD-10-CM | POA: Diagnosis not present

## 2018-11-17 DIAGNOSIS — U071 COVID-19: Secondary | ICD-10-CM | POA: Diagnosis not present

## 2018-11-17 DIAGNOSIS — R110 Nausea: Secondary | ICD-10-CM | POA: Diagnosis not present

## 2018-11-17 DIAGNOSIS — R509 Fever, unspecified: Secondary | ICD-10-CM | POA: Diagnosis not present

## 2018-11-17 DIAGNOSIS — R0602 Shortness of breath: Secondary | ICD-10-CM | POA: Diagnosis not present

## 2018-11-17 DIAGNOSIS — R109 Unspecified abdominal pain: Secondary | ICD-10-CM | POA: Diagnosis not present

## 2018-11-17 DIAGNOSIS — R197 Diarrhea, unspecified: Secondary | ICD-10-CM | POA: Diagnosis not present

## 2018-11-17 DIAGNOSIS — J988 Other specified respiratory disorders: Secondary | ICD-10-CM | POA: Diagnosis not present

## 2018-11-17 DIAGNOSIS — R0789 Other chest pain: Secondary | ICD-10-CM | POA: Diagnosis not present

## 2018-11-17 DIAGNOSIS — I10 Essential (primary) hypertension: Secondary | ICD-10-CM | POA: Diagnosis not present

## 2018-11-17 DIAGNOSIS — R05 Cough: Secondary | ICD-10-CM | POA: Diagnosis not present

## 2018-11-19 DIAGNOSIS — U071 COVID-19: Secondary | ICD-10-CM | POA: Diagnosis not present

## 2018-11-21 DIAGNOSIS — U071 COVID-19: Secondary | ICD-10-CM | POA: Diagnosis not present

## 2018-11-25 DIAGNOSIS — G4733 Obstructive sleep apnea (adult) (pediatric): Secondary | ICD-10-CM | POA: Diagnosis not present

## 2018-12-25 DIAGNOSIS — G4733 Obstructive sleep apnea (adult) (pediatric): Secondary | ICD-10-CM | POA: Diagnosis not present

## 2019-01-08 IMAGING — CT CT ABD-PELV W/ CM
2 of 5 series · 16 of 46 positions shown, 18 images · IV contrast (APPLIED)
Comparison: CT abdomen pelvis dated June 20, 2017.

CLINICAL DATA: Follow-up for appendicitis.

EXAM:
CT ABDOMEN AND PELVIS WITH CONTRAST
TECHNIQUE: Multidetector CT imaging of the abdomen and pelvis was performed
using the standard protocol following bolus administration of
intravenous contrast.
CONTRAST:  85mL [id number] IOPAMIDOL ([id number]) INJECTION 76%

[Series 2: routine abd/pel with · axial · 0.95mm/px · z∈[-275,+205]mm · 13 of 109 slices shown, 15 images]
[im 7/109  soft-tissue]
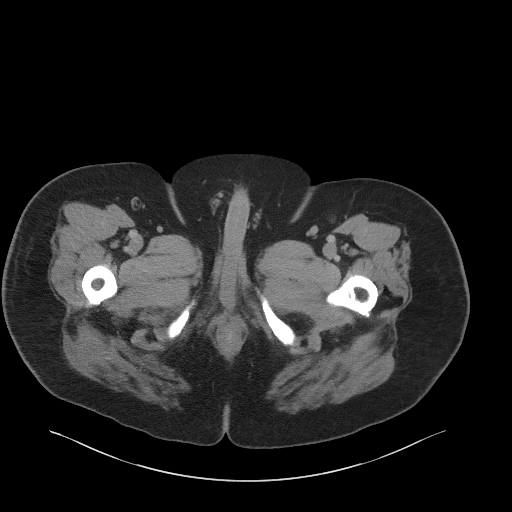
[im 7/109  bone]
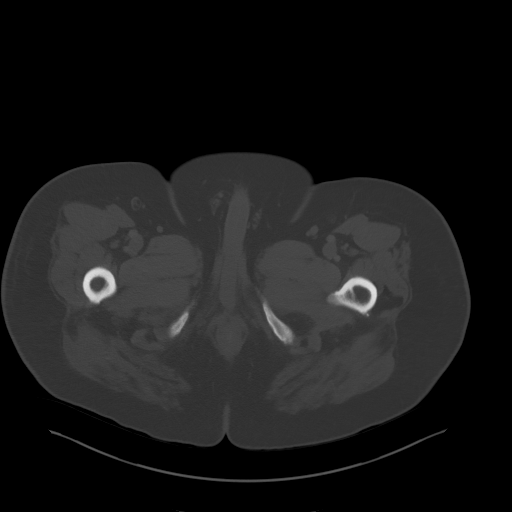
[im 13/109  soft-tissue]
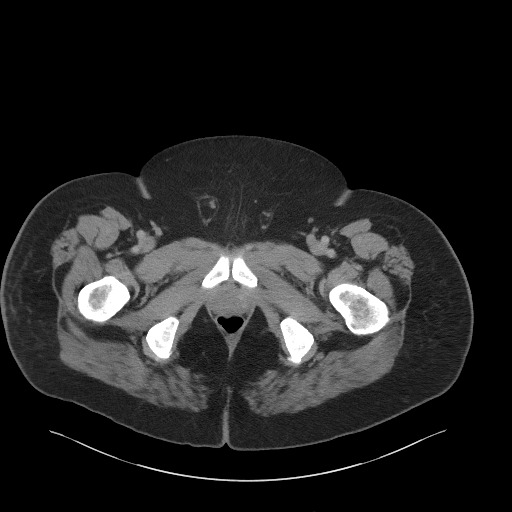
[im 25/109  soft-tissue]
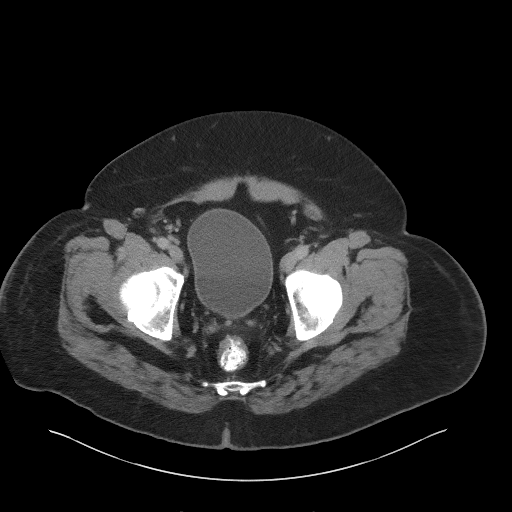
[im 31/109  soft-tissue]
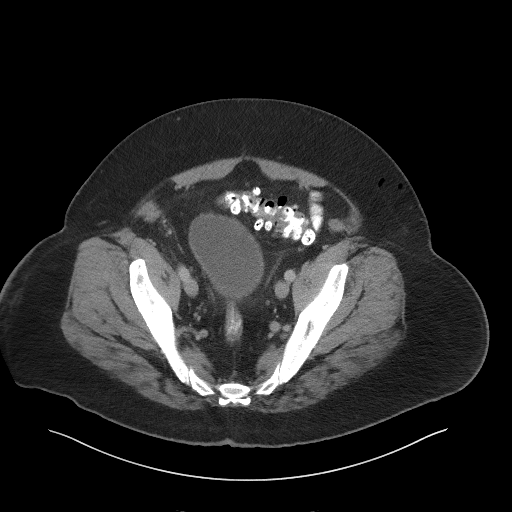
[im 37/109  soft-tissue]
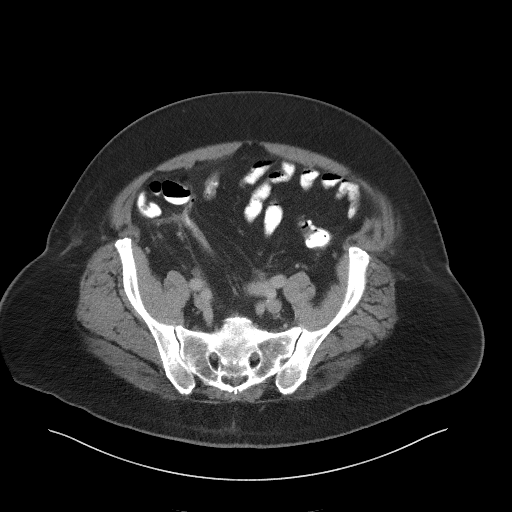
[im 49/109  soft-tissue]
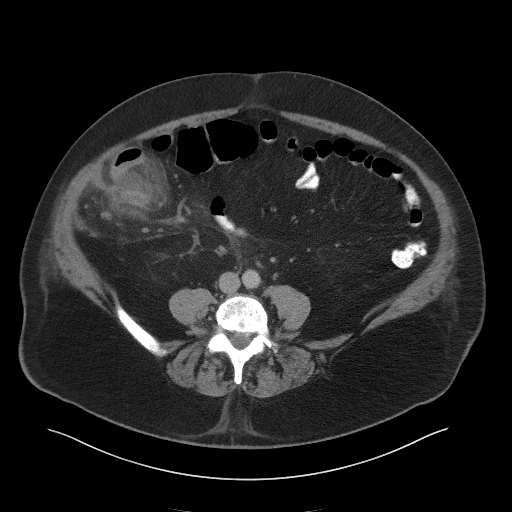
[im 55/109  soft-tissue]
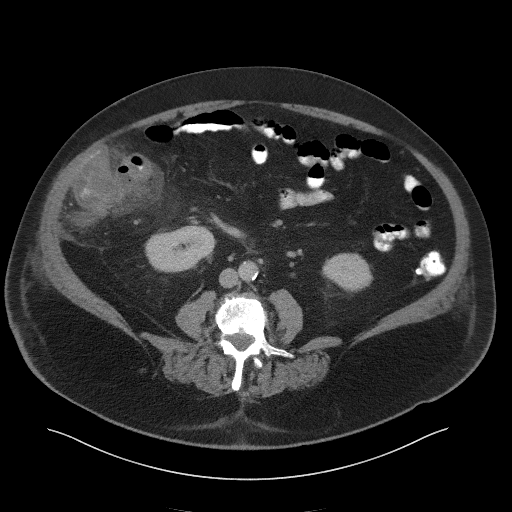
[im 61/109  soft-tissue]
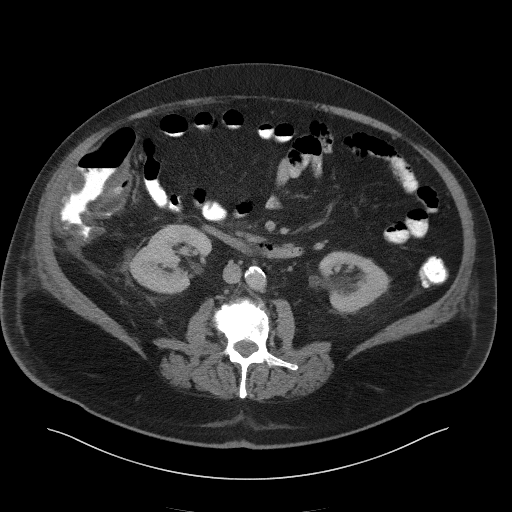
[im 73/109  soft-tissue]
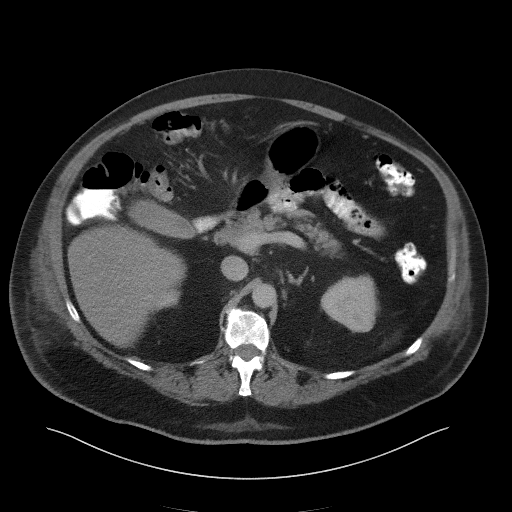
[im 73/109  bone]
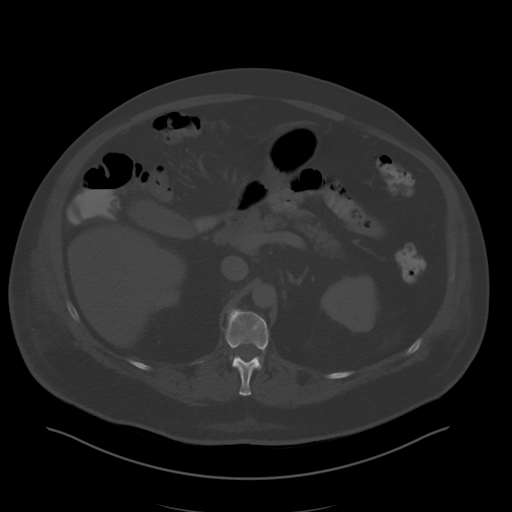
[im 79/109  soft-tissue]
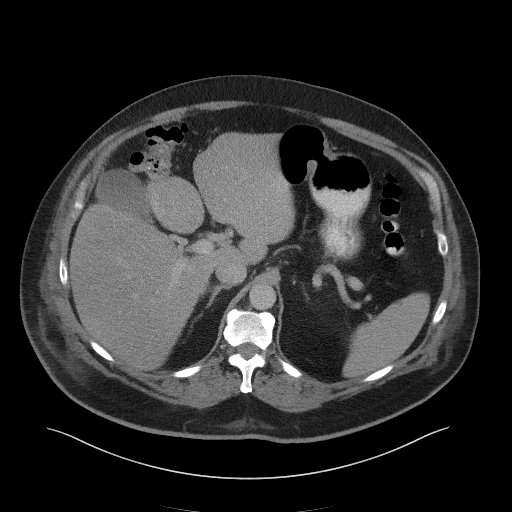
[im 85/109  soft-tissue]
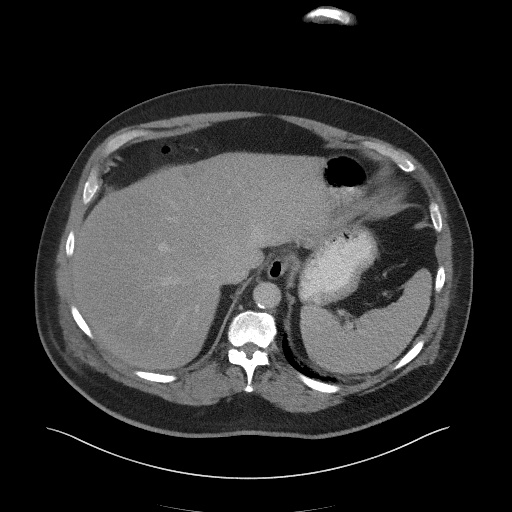
[im 97/109  soft-tissue]
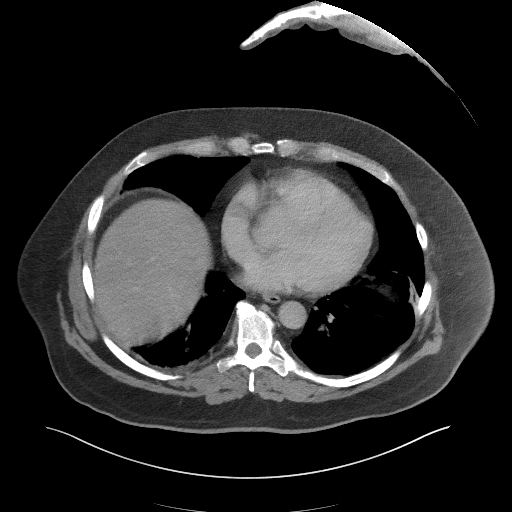
[im 103/109  soft-tissue]
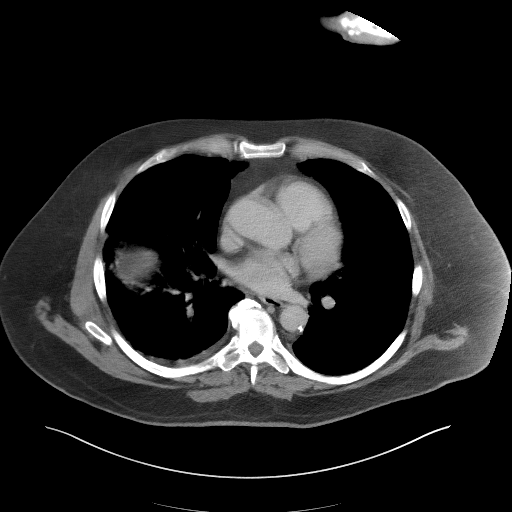

[Series 5: coronal st · coronal · 0.98mm/px · 3 of 109 slices shown]
[im 37/109  soft-tissue]
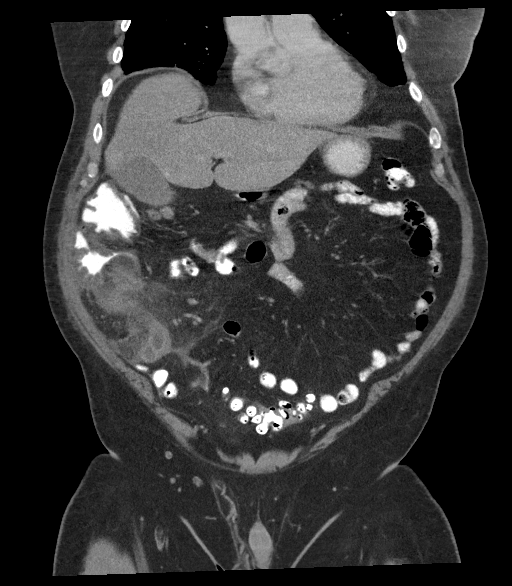
[im 49/109  soft-tissue]
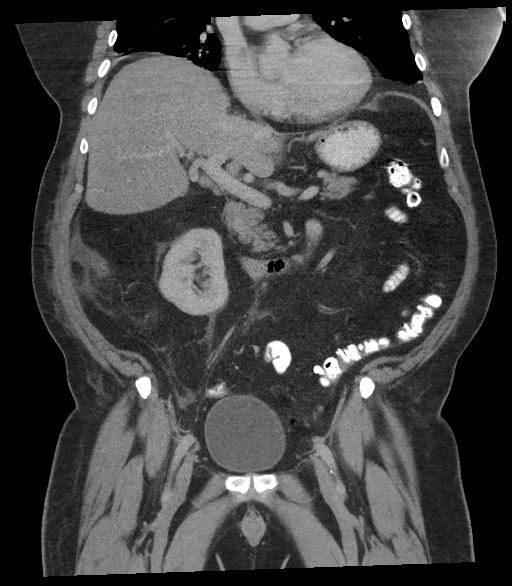
[im 61/109  soft-tissue]
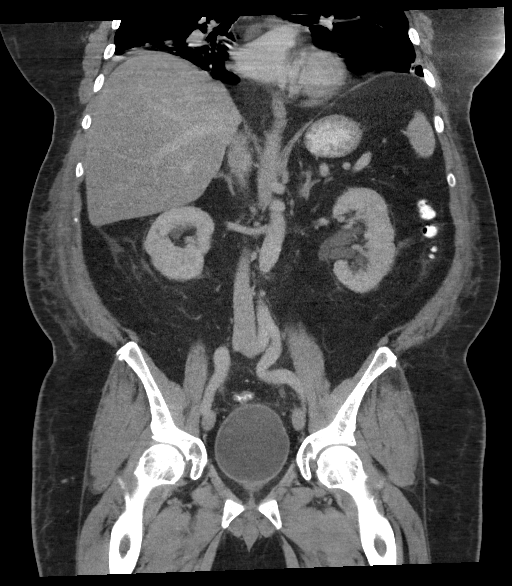

[16 of 46 positions shown; findings below may reference images not displayed]

FINDINGS: Lower chest: New trace right pleural effusion.

Hepatobiliary: No focal liver abnormality is seen. No gallstones,
gallbladder wall thickening, or biliary dilatation.

Pancreas: Unremarkable. No pancreatic ductal dilatation or
surrounding inflammatory changes.

Spleen: Normal in size without focal abnormality.

Adrenals/Urinary Tract: Adrenal glands are unremarkable. Mild
prominence of the left renal collecting system is unchanged. Kidneys
are otherwise normal, without renal calculi, focal lesion, or
hydronephrosis. Bladder is unremarkable.

Stomach/Bowel: The appendix remains dilated up to 22 mm, with
prominent mucosal hyperenhancement, and unchanged appendicolith at
the base. A few foci of extraluminal gas are again seen adjacent to
the terminal ileum. Small amount of periappendiceal free fluid again
noted, without drainable fluid collection. U axilla nchanged severe
wall thickening of the terminal ileum and cecum.

Moderate left-sided colonic diverticulosis.

Vascular/Lymphatic: No significant vascular findings are present. No
enlarged abdominal or pelvic lymph nodes.

Reproductive: Prostate is unremarkable.

Other: None.

Musculoskeletal: No acute or significant osseous findings.
IMPRESSION: 1. Relatively unchanged findings related to acute appendicitis with
severe periappendiceal inflammatory changes and evidence of focal
perforation. Slightly increased extraluminal gas. No discrete
drainable fluid collection.
2. New trace right pleural effusion.

## 2019-01-25 DIAGNOSIS — G4733 Obstructive sleep apnea (adult) (pediatric): Secondary | ICD-10-CM | POA: Diagnosis not present

## 2019-02-15 ENCOUNTER — Telehealth: Payer: Self-pay | Admitting: Cardiology

## 2019-02-15 NOTE — Telephone Encounter (Signed)
New Message    Pts wife is calling and wants to know if she is able to come in for the pts appt      Please call

## 2019-02-15 NOTE — Telephone Encounter (Signed)
Richard Gallagher wife-pt notified pt only into appts [facility]) pt will arrive early wearing a mask.

## 2019-02-18 ENCOUNTER — Other Ambulatory Visit: Payer: Self-pay

## 2019-02-18 ENCOUNTER — Encounter: Payer: Self-pay | Admitting: Cardiology

## 2019-02-18 ENCOUNTER — Ambulatory Visit (INDEPENDENT_AMBULATORY_CARE_PROVIDER_SITE_OTHER): Payer: BC Managed Care – PPO | Admitting: Cardiology

## 2019-02-18 VITALS — BP 131/84 | HR 57 | Temp 97.9°F | Ht 68.0 in | Wt 246.0 lb

## 2019-02-18 DIAGNOSIS — I712 Thoracic aortic aneurysm, without rupture, unspecified: Secondary | ICD-10-CM

## 2019-02-18 DIAGNOSIS — I10 Essential (primary) hypertension: Secondary | ICD-10-CM

## 2019-02-18 DIAGNOSIS — E785 Hyperlipidemia, unspecified: Secondary | ICD-10-CM | POA: Insufficient documentation
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
## 2019-02-18 NOTE — Patient Instructions (Addendum)
Medication Instructions:  NO CHANGES If you need a refill on your cardiac medications before your next appointment, please call your pharmacy.   Lab work:  If you have labs (blood work) drawn today and your tests are completely normal, you will receive your results only by: Marland Kitchen MyChart Message (if you have MyChart) OR . A paper copy in the mail If you have any lab test that is abnormal or we need to change your treatment, we will call you to review the results.  Testing/Procedures: WILL BE SCHEDULE AT [place] Non-Cardiac CT Angiography (CTA) THORACIC AORTIC ANEURSYM, is a special type of CT scan that uses a computer to produce multi-dimensional views of major blood vessels throughout the body. In CT angiography, a contrast material is injected through an IV to help visualize the blood vessels   Follow-Up: At [facility], you and your health needs are our priority.  As part of our continuing mission to provide you with exceptional heart care, we have created designated Provider Care Teams.  These Care Teams include your primary Cardiologist (physician) and Advanced Practice Providers (APPs -  Physician Assistants and Nurse Practitioners) who all work together to provide you with the care you need, when you need it. . You will need a follow up appointment in  12 months-SEPT 2021.  Please call our office 2 months in advance to schedule this appointment.  You may see Glenetta Hew, MD or one of the following Advanced Practice Providers on your designated Care Team:   . Rosaria Ferries, PA-C . Jory Sims, DNP, ANP  Any Other Special Instructions Will Be Listed Below (If Applicable).

## 2019-02-20 ENCOUNTER — Encounter: Payer: Self-pay | Admitting: Cardiology

## 2019-02-20 NOTE — Assessment & Plan Note (Signed)
We talked about dietary adjustment.  I think probably to get LDL below 100 you likely will need statin.  Most recent lipids showed LDL 107.  Would like to see it less than 100.  Low threshold to consider adding low-dose statin.  Will defer to PCP when he has his next lab check.

## 2019-02-20 NOTE — Assessment & Plan Note (Signed)
This is been evaluated by both CT and echocardiogram.  I think CT is probably more accurate for assessing the true size.  We will check a CT this year.  Standard has been the check CT every year.  We can hold off on checking echocardiogram since her no active cardiac symptoms.  Simply need blood pressure control lipid control. LDL is not quite ready 130, may want to consider adding statin pending next follow-up labs. Blood pressures pretty well controlled on beta-blocker and HCTZ.

## 2019-02-20 NOTE — Assessment & Plan Note (Signed)
Has been on an ARB, but is no longer listed.  He seems to be pretty well controlled on beta-blocker and HCTZ.    If pressures were to increase, would consider restarting ARB.

## 2019-03-08 ENCOUNTER — Other Ambulatory Visit: Payer: Self-pay

## 2019-03-08 ENCOUNTER — Ambulatory Visit: Admission: RE | Admit: 2019-03-08 | Payer: BC Managed Care – PPO | Source: Ambulatory Visit

## 2019-03-10 ENCOUNTER — Telehealth: Payer: Self-pay | Admitting: Cardiology

## 2019-03-10 NOTE — Telephone Encounter (Signed)
Wife called stating patient wasn't able to get his CT scan done due to insurance problems, once they get that straighten out they will call with new date when it is reschedule.

## 2019-03-27 DIAGNOSIS — G4733 Obstructive sleep apnea (adult) (pediatric): Secondary | ICD-10-CM | POA: Diagnosis not present

## 2019-04-07 ENCOUNTER — Ambulatory Visit
Admission: RE | Admit: 2019-04-07 | Discharge: 2019-04-07 | Disposition: A | Discharge status: Home / Self Care | Payer: BC Managed Care – PPO | Source: Ambulatory Visit | Attending: Cardiology | Admitting: Cardiology

## 2019-04-07 ENCOUNTER — Other Ambulatory Visit: Payer: Self-pay

## 2019-04-07 DIAGNOSIS — I712 Thoracic aortic aneurysm, without rupture, unspecified: Secondary | ICD-10-CM

## 2019-04-07 LAB — POCT I-STAT CREATININE: Creatinine, Ser: 0.6 mg/dL — ABNORMAL LOW (ref 0.61–1.24)

## 2019-04-07 MED ORDER — IOHEXOL 350 MG/ML SOLN
75.0000 mL | Freq: Once | INTRAVENOUS | Status: AC | PRN
  Administered 2019-04-07: 75 mL via INTRAVENOUS

## 2019-04-26 ENCOUNTER — Telehealth: Payer: Self-pay | Admitting: Pulmonary Disease

## 2019-04-26 NOTE — Telephone Encounter (Signed)
Dr. Vaughan Browner, due to pt just having the CT angio aorta which was ordered by cardiology, please advise if we can cancel the CT angio which you ordered.

## 2019-04-27 ENCOUNTER — Telehealth: Payer: Self-pay | Admitting: *Deleted

## 2019-04-27 DIAGNOSIS — G4733 Obstructive sleep apnea (adult) (pediatric): Secondary | ICD-10-CM | POA: Diagnosis not present

## 2019-04-27 DIAGNOSIS — I712 Thoracic aortic aneurysm, without rupture, unspecified: Secondary | ICD-10-CM

## 2019-04-27 DIAGNOSIS — I77810 Thoracic aortic ectasia: Secondary | ICD-10-CM

## 2019-04-27 DIAGNOSIS — Z01818 Encounter for other preprocedural examination: Secondary | ICD-10-CM

## 2019-04-27 NOTE — Telephone Encounter (Signed)
Yes. We can cancel our CT if it has already been ordered by cardiology

## 2019-04-27 NOTE — Telephone Encounter (Signed)
CT scan shows slightly increased dilation of the aortic root as it comes off the heart, but relatively stable findings in the ascending aorta.   Relatively stable, but potentially borderlining on need to evaluate more frequently.   I would like to check an echocardiogram in 6 months and then on the CT scan in a year. I will discuss these findings with CT surgery to determine their thought processes on timing for doing something about the aortic root.   Glenetta Hew, MD     SPOKE TO PATIENT - RESULT GIVEN VOICED UNDERSTANDING . ORDER PLACED FOR ECHO AND CHEST CTA - AORTA

## 2019-04-27 NOTE — Telephone Encounter (Signed)
Left message for patient or [name] to call back.

## 2019-04-27 NOTE — Telephone Encounter (Signed)
-----   Message from Leonie Man, MD sent at 04/25/2019 11:19 PM EST ----- Ivin Booty - based upon Dr. Vivi Martens advice - concurring with my plan, lets go a head with Echo in 6 months & Chest CTA in 1 yr.  Glenetta Hew, MD

## 2019-04-28 NOTE — Telephone Encounter (Signed)
Spoke with [name]. Made her aware of Dr. Matilde Bash response. It looks as though the CT has already been canceled. Nothing further was needed.

## 2019-05-13 ENCOUNTER — Ambulatory Visit: Payer: BC Managed Care – PPO

## 2019-06-16 DIAGNOSIS — Z1331 Encounter for screening for depression: Secondary | ICD-10-CM | POA: Diagnosis not present

## 2019-06-16 DIAGNOSIS — Z23 Encounter for immunization: Secondary | ICD-10-CM | POA: Diagnosis not present

## 2019-06-16 DIAGNOSIS — Z125 Encounter for screening for malignant neoplasm of prostate: Secondary | ICD-10-CM | POA: Diagnosis not present

## 2019-06-16 DIAGNOSIS — I700 Atherosclerosis of aorta: Secondary | ICD-10-CM | POA: Diagnosis not present

## 2019-06-16 DIAGNOSIS — K760 Fatty (change of) liver, not elsewhere classified: Secondary | ICD-10-CM | POA: Diagnosis not present

## 2019-06-16 DIAGNOSIS — J449 Chronic obstructive pulmonary disease, unspecified: Secondary | ICD-10-CM | POA: Diagnosis not present

## 2019-06-16 DIAGNOSIS — R739 Hyperglycemia, unspecified: Secondary | ICD-10-CM | POA: Diagnosis not present

## 2019-06-16 DIAGNOSIS — I10 Essential (primary) hypertension: Secondary | ICD-10-CM | POA: Diagnosis not present

## 2019-06-21 DIAGNOSIS — G4733 Obstructive sleep apnea (adult) (pediatric): Secondary | ICD-10-CM | POA: Diagnosis not present

## 2019-06-27 DIAGNOSIS — G4733 Obstructive sleep apnea (adult) (pediatric): Secondary | ICD-10-CM | POA: Diagnosis not present

## 2019-10-19 DIAGNOSIS — Z23 Encounter for immunization: Secondary | ICD-10-CM | POA: Diagnosis not present

## 2019-11-21 ENCOUNTER — Other Ambulatory Visit (HOSPITAL_COMMUNITY): Payer: PRIVATE HEALTH INSURANCE

## 2019-12-02 ENCOUNTER — Encounter (HOSPITAL_COMMUNITY): Payer: Self-pay | Admitting: Cardiology

## 2019-12-13 ENCOUNTER — Telehealth (HOSPITAL_COMMUNITY): Payer: Self-pay | Admitting: Cardiology

## 2019-12-13 NOTE — Telephone Encounter (Signed)
Just an FYI. We have made several attempts to contact this patient including sending a letter to schedule or reschedule their echocardiogram. We will be removing the patient from the echo [place].  12/02/2019 MAILED LETTER  12/02/2019 Called and LB @ 9:22/LBW  12/01/19 Called and LineBusy@9 :01/LBW      Thank you

## 2020-01-26 ENCOUNTER — Telehealth: Payer: Self-pay | Admitting: Cardiology

## 2020-01-26 NOTE — Telephone Encounter (Signed)
Attempted to contact patient at both numbers listed to get scheduled with Ellyn Hack from recall list, both lines were busy

## 2020-04-19 ENCOUNTER — Encounter: Payer: Self-pay | Admitting: General Practice

## 2020-04-19 ENCOUNTER — Telehealth: Payer: Self-pay | Admitting: Cardiology

## 2020-04-19 NOTE — Telephone Encounter (Signed)
  Recall expunge letter sent 

## 2020-04-25 ENCOUNTER — Encounter: Payer: Self-pay | Admitting: Cardiology

## 2020-04-25 ENCOUNTER — Telehealth: Payer: Self-pay | Admitting: Cardiology

## 2020-04-25 NOTE — Telephone Encounter (Signed)
Called patient at [phone] (not in service) and [phone] (not in service) to discuss scheduling the CTA chest aorta that was ordered by Dr. Ellyn Hack..  Patient was sent a My Chart message on 04/04/20--no response.  Will mail letter requesting patient contact the office.

## 2020-07-09 ENCOUNTER — Encounter (INDEPENDENT_AMBULATORY_CARE_PROVIDER_SITE_OTHER): Payer: Self-pay | Admitting: Otolaryngology

## 2020-07-09 ENCOUNTER — Other Ambulatory Visit: Payer: Self-pay

## 2020-07-09 ENCOUNTER — Ambulatory Visit (INDEPENDENT_AMBULATORY_CARE_PROVIDER_SITE_OTHER): Payer: No Typology Code available for payment source | Admitting: Otolaryngology

## 2020-07-09 VITALS — Temp 97.9°F

## 2020-07-09 DIAGNOSIS — J310 Chronic rhinitis: Secondary | ICD-10-CM | POA: Diagnosis not present

## 2020-07-09 DIAGNOSIS — Z8669 Personal history of other diseases of the nervous system and sense organs: Secondary | ICD-10-CM
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
## 2021-01-04 ENCOUNTER — Other Ambulatory Visit: Payer: Self-pay | Admitting: Cardiology

## 2021-01-04 DIAGNOSIS — I712 Thoracic aortic aneurysm, without rupture, unspecified: Secondary | ICD-10-CM

## 2021-01-11 ENCOUNTER — Ambulatory Visit
Admission: RE | Admit: 2021-01-11 | Discharge: 2021-01-11 | Disposition: A | Discharge status: Home / Self Care | Payer: Medicare Other | Source: Ambulatory Visit | Attending: Cardiology | Admitting: Cardiology

## 2021-01-11 ENCOUNTER — Other Ambulatory Visit: Payer: Self-pay

## 2021-01-11 DIAGNOSIS — I712 Thoracic aortic aneurysm, without rupture, unspecified: Secondary | ICD-10-CM

## 2021-01-11 MED ORDER — IOHEXOL 350 MG/ML SOLN
75.0000 mL | Freq: Once | INTRAVENOUS | Status: AC | PRN
  Administered 2021-01-11: 75 mL via INTRAVENOUS

## 2021-01-22 ENCOUNTER — Ambulatory Visit: Payer: No Typology Code available for payment source | Admitting: Physician Assistant

## 2021-03-27 ENCOUNTER — Encounter (INDEPENDENT_AMBULATORY_CARE_PROVIDER_SITE_OTHER): Payer: Self-pay

## 2021-08-21 DIAGNOSIS — Z9989 Dependence on other enabling machines and devices: Secondary | ICD-10-CM | POA: Diagnosis not present

## 2021-08-21 DIAGNOSIS — I77810 Thoracic aortic ectasia: Secondary | ICD-10-CM | POA: Diagnosis not present

## 2021-08-21 DIAGNOSIS — J45909 Unspecified asthma, uncomplicated: Secondary | ICD-10-CM | POA: Diagnosis not present

## 2021-08-21 DIAGNOSIS — R7303 Prediabetes: Secondary | ICD-10-CM | POA: Diagnosis not present

## 2021-08-21 DIAGNOSIS — I712 Thoracic aortic aneurysm, without rupture, unspecified: Secondary | ICD-10-CM | POA: Diagnosis not present

## 2021-08-21 DIAGNOSIS — G4733 Obstructive sleep apnea (adult) (pediatric): Secondary | ICD-10-CM | POA: Diagnosis not present

## 2021-08-21 DIAGNOSIS — I10 Essential (primary) hypertension: Secondary | ICD-10-CM | POA: Diagnosis not present

## 2021-08-21 DIAGNOSIS — M109 Gout, unspecified: Secondary | ICD-10-CM | POA: Diagnosis not present

## 2021-08-21 DIAGNOSIS — I700 Atherosclerosis of aorta: Secondary | ICD-10-CM | POA: Diagnosis not present

## 2021-08-21 DIAGNOSIS — G4700 Insomnia, unspecified: Secondary | ICD-10-CM | POA: Diagnosis not present

## 2021-08-21 DIAGNOSIS — E785 Hyperlipidemia, unspecified: Secondary | ICD-10-CM | POA: Diagnosis not present

## 2021-08-21 DIAGNOSIS — R739 Hyperglycemia, unspecified: Secondary | ICD-10-CM | POA: Diagnosis not present

## 2021-09-16 DIAGNOSIS — Z9181 History of falling: Secondary | ICD-10-CM | POA: Diagnosis not present

## 2021-09-16 DIAGNOSIS — E785 Hyperlipidemia, unspecified: Secondary | ICD-10-CM | POA: Diagnosis not present

## 2021-09-16 DIAGNOSIS — Z0000 Encounter for general adult medical examination without abnormal findings: Secondary | ICD-10-CM | POA: Diagnosis not present

## 2021-09-18 ENCOUNTER — Telehealth: Payer: Self-pay | Admitting: Gastroenterology

## 2021-09-18 ENCOUNTER — Other Ambulatory Visit: Payer: Self-pay

## 2021-09-18 DIAGNOSIS — Z86010 Personal history of colonic polyps: Secondary | ICD-10-CM

## 2021-09-18 NOTE — Progress Notes (Signed)
Gastroenterology Pre-Procedure Review ? ?Request Date: 10/08/21 ?Requesting Physician: Dr. Marius Ditch ? ?PATIENT REVIEW QUESTIONS: The patient responded to the following health history questions as indicated:   ? ?1. Are you having any GI issues? yes (belly ache) ?2. Do you have a personal history of Polyps? yes (last colonoscopy was 01/11/18 with Dr. Marius Ditch multiple polyps in the past) ?3. Do you have a family history of Colon Cancer or Polyps? no ?4. Diabetes Mellitus? no ?5. Joint replacements in the past 12 months?no ?6. Major health problems in the past 3 months?no ?7. Any artificial heart valves, MVP, or defibrillator?no ?   ?MEDICATIONS & ALLERGIES:    ?Patient reports the following regarding taking any anticoagulation/antiplatelet therapy:   ?Plavix, Coumadin, Eliquis, Xarelto, Lovenox, Pradaxa, Brilinta, or Effient? no ?Aspirin? yes (81 mg daily) ? ?Patient confirms/reports the following medications:  ?Current Outpatient Medications  ?Medication Sig Dispense Refill  ? amLODipine (NORVASC) 5 MG tablet Take 5 mg by mouth daily.    ? aspirin EC 81 MG tablet Take 81 mg by mouth daily.    ? atenolol (TENORMIN) 50 MG tablet Take 50 mg by mouth every morning.   0  ? gabapentin (NEURONTIN) 300 MG capsule Take 300-600 mg by mouth at bedtime.    ? meloxicam (MOBIC) 15 MG tablet Take 15 mg by mouth daily.    ? triamcinolone cream (KENALOG) 0.1 % Apply 1 application topically 2 (two) times daily as needed (itching).   1  ? zolpidem (AMBIEN) 5 MG tablet Take 5 mg by mouth at bedtime as needed for sleep.   1  ? ?No current facility-administered medications for this visit.  ? ? ?Patient confirms/reports the following allergies:  ?No Known Allergies ? ?No orders of the defined types were placed in this encounter. ? ? ?AUTHORIZATION INFORMATION ?Primary Insurance: ?1D#: ?Group #: ? ?Secondary Insurance: ?1D#: ?Group #: ? ?SCHEDULE INFORMATION: ?Date: 10/08/21 ?Time: ?Location: [facility] ?

## 2021-09-18 NOTE — Telephone Encounter (Signed)
Patient needing a repeat colonoscopy.  ?

## 2021-09-21 DIAGNOSIS — L02212 Cutaneous abscess of back [any part, except buttock]: Secondary | ICD-10-CM | POA: Diagnosis not present

## 2021-09-25 DIAGNOSIS — L0291 Cutaneous abscess, unspecified: Secondary | ICD-10-CM | POA: Diagnosis not present

## 2021-10-08 ENCOUNTER — Encounter
Admission: RE | Disposition: A | Discharge status: Home / Self Care | Payer: Self-pay | Source: Home / Self Care | Attending: Gastroenterology

## 2021-10-08 ENCOUNTER — Ambulatory Visit: Payer: Medicare Other | Admitting: Registered Nurse

## 2021-10-08 ENCOUNTER — Ambulatory Visit
Admission: RE | Admit: 2021-10-08 | Discharge: 2021-10-08 | Disposition: A | Discharge status: Home / Self Care | Payer: Medicare Other | Attending: Gastroenterology | Admitting: Gastroenterology

## 2021-10-08 DIAGNOSIS — E668 Other obesity: Secondary | ICD-10-CM | POA: Insufficient documentation

## 2021-10-08 DIAGNOSIS — I998 Other disorder of circulatory system: Secondary | ICD-10-CM | POA: Insufficient documentation

## 2021-10-08 DIAGNOSIS — Z6836 Body mass index (BMI) 36.0-36.9, adult: Secondary | ICD-10-CM | POA: Diagnosis not present

## 2021-10-08 DIAGNOSIS — I252 Old myocardial infarction: Secondary | ICD-10-CM | POA: Insufficient documentation

## 2021-10-08 DIAGNOSIS — D122 Benign neoplasm of ascending colon: Secondary | ICD-10-CM | POA: Insufficient documentation

## 2021-10-08 DIAGNOSIS — J449 Chronic obstructive pulmonary disease, unspecified: Secondary | ICD-10-CM | POA: Insufficient documentation

## 2021-10-08 DIAGNOSIS — K635 Polyp of colon: Secondary | ICD-10-CM | POA: Diagnosis not present

## 2021-10-08 DIAGNOSIS — G4730 Sleep apnea, unspecified: Secondary | ICD-10-CM | POA: Diagnosis not present

## 2021-10-08 DIAGNOSIS — Z86010 Personal history of colonic polyps: Secondary | ICD-10-CM | POA: Insufficient documentation

## 2021-10-08 DIAGNOSIS — I10 Essential (primary) hypertension: Secondary | ICD-10-CM | POA: Insufficient documentation

## 2021-10-08 DIAGNOSIS — D121 Benign neoplasm of appendix: Secondary | ICD-10-CM | POA: Diagnosis not present

## 2021-10-08 DIAGNOSIS — Z1211 Encounter for screening for malignant neoplasm of colon: Secondary | ICD-10-CM | POA: Insufficient documentation

## 2021-10-08 HISTORY — PX: COLONOSCOPY WITH PROPOFOL: SHX5780

## 2021-10-08 SURGERY — COLONOSCOPY WITH PROPOFOL
Anesthesia: General

## 2021-10-08 MED ORDER — PROPOFOL 500 MG/50ML IV EMUL
INTRAVENOUS | Status: DC | PRN
Start: 2021-10-08 — End: 2021-10-08
  Administered 2021-10-08: 150 ug/kg/min via INTRAVENOUS

## 2021-10-08 MED ORDER — PROPOFOL 10 MG/ML IV BOLUS
INTRAVENOUS | Status: DC | PRN
  Administered 2021-10-08: 80 mg via INTRAVENOUS
  Administered 2021-10-08: 10 mg via INTRAVENOUS

## 2021-10-08 MED ORDER — SODIUM CHLORIDE 0.9 % IV SOLN: INTRAVENOUS | Status: DC

## 2021-10-08 MED ORDER — LIDOCAINE HCL (PF) 2 % IJ SOLN
INTRAMUSCULAR | Status: AC
  Filled 2021-10-08: qty 5

## 2021-10-08 MED ORDER — LIDOCAINE HCL (CARDIAC) PF 100 MG/5ML IV SOSY
PREFILLED_SYRINGE | INTRAVENOUS | Status: DC | PRN
  Administered 2021-10-08: 40 mg via INTRAVENOUS

## 2021-10-08 MED ORDER — PROPOFOL 500 MG/50ML IV EMUL
INTRAVENOUS | Status: AC
  Filled 2021-10-08: qty 50

## 2021-10-08 NOTE — Anesthesia Postprocedure Evaluation (Signed)
Anesthesia Post Note ? ?Patient: Richard Gallagher ? ?Procedure(s) Performed: COLONOSCOPY WITH PROPOFOL ? ?Patient location during evaluation: Endoscopy ?Anesthesia Type: General ?Level of consciousness: awake and alert ?Pain management: pain level controlled ?Vital Signs Assessment: post-procedure vital signs reviewed and stable ?Respiratory status: spontaneous breathing, nonlabored ventilation, respiratory function stable and patient connected to nasal cannula oxygen ?Cardiovascular status: blood pressure returned to baseline and stable ?Postop Assessment: no apparent nausea or vomiting ?Anesthetic complications: no ? ? ?No notable events documented. ? ? ?Last Vitals:  ?Vitals:  ? 10/08/21 1139 10/08/21 1149  ?BP: 119/70 121/77  ?Pulse: 62 (!) 59  ?Resp: 14 13  ?Temp:    ?SpO2: 97% 96%  ?  ?Last Pain:  ?Vitals:  ? 10/08/21 1149  ?TempSrc:   ?PainSc: 0-No pain  ? ? ?  ?  ?  ?  ?  ?  ? ?Precious Haws Calista Crain ? ? ? ? ?

## 2021-10-08 NOTE — Op Note (Signed)
[facility] ?Gastroenterology ?Patient Name: Richard Gallagher ?Procedure Date: 10/08/2021 10:50 AM ?MRN: [id number] ?Account #: [id number] ?Date of Birth: 07/02/1955 ?Admit Type: Outpatient ?Age: 66 ?Room: [facility] ENDO ROOM 3 ?Gender: Male ?Note Status: Finalized ?Instrument Name: Colonoscope [id number] ?Procedure:             Colonoscopy ?Indications:           Surveillance: Personal history of adenomatous polyps  ?                       on last colonoscopy 3 years ago, Last colonoscopy:  ?                       August 2019 ?Providers:             Lin Landsman MD, MD ?Referring MD:          Lorin Mercy. Hamrick (Referring MD) ?Medicines:             General Anesthesia ?Complications:         No immediate complications. Estimated blood loss: None. ?Procedure:             Pre-Anesthesia Assessment: ?                       - Prior to the procedure, a History and Physical was  ?                       performed, and patient medications and allergies were  ?                       reviewed. The patient is competent. The risks and  ?                       benefits of the procedure and the sedation options and  ?                       risks were discussed with the patient. All questions  ?                       were answered and informed consent was obtained.  ?                       Patient identification and proposed procedure were  ?                       verified by the physician, the nurse, the  ?                       anesthesiologist, the anesthetist and the technician  ?                       in the pre-procedure area in the procedure room in the  ?                       endoscopy suite. Mental Status Examination: alert and  ?                       oriented. Airway Examination: normal oropharyngeal  ?  airway and neck mobility. Respiratory Examination:  ?                       clear to auscultation. CV Examination: normal.  ?                       Prophylactic Antibiotics: The patient  does not require  ?                       prophylactic antibiotics. Prior Anticoagulants: The  ?                       patient has taken no previous anticoagulant or  ?                       antiplatelet agents. ASA Grade Assessment: III - A  ?                       patient with severe systemic disease. After reviewing  ?                       the risks and benefits, the patient was deemed in  ?                       satisfactory condition to undergo the procedure. The  ?                       anesthesia plan was to use general anesthesia.  ?                       Immediately prior to administration of medications,  ?                       the patient was re-assessed for adequacy to receive  ?                       sedatives. The heart rate, respiratory rate, oxygen  ?                       saturations, blood pressure, adequacy of pulmonary  ?                       ventilation, and response to care were monitored  ?                       throughout the procedure. The physical status of the  ?                       patient was re-assessed after the procedure. ?                       After obtaining informed consent, the colonoscope was  ?                       passed under direct vision. Throughout the procedure,  ?                       the patient's blood pressure, pulse, and oxygen  ?  saturations were monitored continuously. The  ?                       Colonoscope was introduced through the anus and  ?                       advanced to the the cecum, identified by appendiceal  ?                       orifice and ileocecal valve. The colonoscopy was  ?                       performed without difficulty. The patient tolerated  ?                       the procedure well. The quality of the bowel  ?                       preparation was evaluated using the BBPS [facility] Bowel  ?                       Preparation Scale) with scores of: Right Colon = 3,  ?                       Transverse Colon = 3  and Left Colon = 3 (entire mucosa  ?                       seen well with no residual staining, small fragments  ?                       of stool or opaque liquid). The total BBPS score  ?                       equals 9. ?Findings: ?     A 12 mm polyp was found at the prior appendectomy site. The polyp was  ?     sessile. The polyp was removed with a hot snare. Resection and retrieval  ?     were complete. ?     Two sessile polyps were found in the ascending colon. The polyps were 3  ?     to 4 mm in size. These polyps were removed with a cold snare. Resection  ?     and retrieval were complete. ?     A diminutive polyp was found in the ascending colon. The polyp was  ?     sessile. The polyp was removed with a cold biopsy forceps. Resection and  ?     retrieval were complete. ?     The retroflexed view of the distal rectum and anal verge was normal and  ?     showed no anal or rectal abnormalities. ?     A tattoo was seen in the ascending colon. A post-polypectomy scar was  ?     found at the tattoo site. There was no evidence of residual polyp tissue. ?Impression:            - One 12 mm polyp at the previous appendectomy site,  ?  removed with a hot snare. Resected and retrieved. ?                       - Two 3 to 4 mm polyps in the ascending colon, removed  ?                       with a cold snare. Resected and retrieved. ?                       - One diminutive polyp in the ascending colon, removed  ?                       with a cold biopsy forceps. Resected and retrieved. ?                       - The distal rectum and anal verge are normal on  ?                       retroflexion view. ?                       - A tattoo was seen in the ascending colon. A  ?                       post-polypectomy scar was found at the tattoo site.  ?                       There was no evidence of residual polyp tissue. ?Recommendation:        - Discharge patient to home (with escort). ?                        - Resume previous diet today. ?                       - Continue present medications. ?                       - Await pathology results. ?                       - Repeat colonoscopy in 3 - 5 years for surveillance  ?                       based on pathology results. ?Procedure Code(s):     --- Professional --- ?                       [id number], Colonoscopy, flexible; with removal of  ?                       tumor(s), polyp(s), or other lesion(s) by snare  ?                       technique ?                       45380, 59, Colonoscopy, flexible; with biopsy, single  ?  or multiple ?Diagnosis Code(s):     --- Professional --- ?                       K63.5, Polyp of colon ?                       Z86.010, Personal history of colonic polyps ?CPT copyright 2019 American Medical Association. All rights reserved. ?The codes documented in this report are preliminary and upon coder review may  ?be revised to meet current compliance requirements. ?Dr. Ulyess Mort ?Syaire Saber Raeanne Gathers MD, MD ?10/08/2021 11:26:10 AM ?This report has been signed electronically. ?Number of Addenda: 0 ?Note Initiated On: 10/08/2021 10:50 AM ?Scope Withdrawal Time: 0 hours 14 minutes 43 seconds  ?Total Procedure Duration: 0 hours 19 minutes 41 seconds  ?Estimated Blood Loss:  Estimated blood loss: none. ?     [facility] ?
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
## 2021-10-08 NOTE — Transfer of Care (Signed)
Immediate Anesthesia Transfer of Care Note ? ?Patient: Richard Gallagher ? ?Procedure(s) Performed: Procedure(s): ?COLONOSCOPY WITH PROPOFOL (N/A) ? ?Patient Location: PACU and Endoscopy Unit ? ?Anesthesia Type:General ? ?Level of Consciousness: sedated ? ?Airway & Oxygen Therapy: Patient Spontanous Breathing and Patient connected to nasal cannula oxygen ? ?Post-op Assessment: Report given to RN and Post -op Vital signs reviewed and stable ? ?Post vital signs: Reviewed and stable ? ?Last Vitals:  ?Vitals:  ? 10/08/21 1035 10/08/21 1129  ?BP: (!) 147/71 111/63  ?Pulse: 64 67  ?Resp: 18 (!) 27  ?Temp: (!) 36.1 ?C (!) 36.2 ?C  ?SpO2: 95% 98%  ? ? ?Complications: No apparent anesthesia complications ?

## 2021-10-08 NOTE — Anesthesia Preprocedure Evaluation (Signed)
Anesthesia Evaluation  ?Patient identified by MRN, date of birth, ID band ?Patient awake ? ? ? ?Reviewed: ?Allergy & Precautions, NPO status , Patient's Chart, lab work & pertinent test results ? ?History of Anesthesia Complications ?Negative for: history of anesthetic complications ? ?Airway ?Mallampati: III ? ?TM Distance: <3 FB ?Neck ROM: full ? ? ? Dental ? ?(+) Chipped, Poor Dentition, Missing ?  ?Pulmonary ?sleep apnea , COPD,  ?  ?Pulmonary exam normal ? ? ? ? ? ? ? Cardiovascular ?Exercise Tolerance: Good ?hypertension, (-) angina+ Past MI and + DOE  ?Normal cardiovascular exam+ dysrhythmias  ? ? ?  ?Neuro/Psych ? Headaches, negative psych ROS  ? GI/Hepatic ?negative GI ROS, Neg liver ROS, neg GERD  ,  ?Endo/Other  ?negative endocrine ROS ? Renal/GU ?negative Renal ROS  ?negative genitourinary ?  ?Musculoskeletal ? ? Abdominal ?  ?Peds ? Hematology ?negative hematology ROS ?(+)   ?Anesthesia Other Findings ?Past Medical History: ?No date: Acute appendicitis ?No date: Aortic atherosclerosis ([place]) ?No date: Arthritis ?No date: Collagen vascular disease ([place]) ?No date: COPD (chronic obstructive pulmonary disease) ([place]) ?No date: Dilated aortic root ([place]) ?No date: Dysrhythmia ?    Comment:  right bundle branch block...(not new) ?02/04/2017: Essential hypertension ?No date: Fatty liver ?No date: Headache ?    Comment:  takes atenolol for this ?No date: Hyperglycemia ?No date: Moderate obesity ?    Comment:  Had been better controlled while he was living in [place] ?             [place]. He gained 30 pounds since returning in January 2018 ?2016: Myocardial infarction [facility]) ?    Comment:  lower right hand side of heart...no damage done ?05/2016: Right bundle branch block (RBBB) on electrocardiogram (ECG) ?06/20/2017: Ruptured appendicitis ?No date: Sleep apnea ?    Comment:  NO CPAP.  instructed years ago to use. lost weight ?No date: Thoracic aortic aneurysm [facility]) ?05/2016: Thoracic  aortic aneurysm without rupture ([place]) ?    Comment:  a. noted on Cardiac Cath with TAA Angio - Aortic Root  ?             5.1 cm. b. echo 02/2017 - dilated aortic root (71m) and  ?             ascending aorta (454m. ? ?Past Surgical History: ?1995: BACK SURGERY ?    Comment:  LOWER ?05/2016: CARDIAC CATHETERIZATION ?    Comment:   ([place]aine [place]. [place] Charon ?             05/22/16: EF 55%. Normal coronary arteries. Dilated  ?             aortic root at 5.1 cm. Dual ostia for left coronary  ?             arteries (LAD and LCx). codominant ?08/04/2017: COLONOSCOPY WITH PROPOFOL; N/A ?    Comment:  Procedure: COLONOSCOPY WITH PROPOFOL;  Surgeon: VaMarius Ditch ?             RoTally DueMD;  Location: [facility] ENDOSCOPY;  Service:  ?             Gastroenterology;  Laterality: N/A; ?01/11/2018: COLONOSCOPY WITH PROPOFOL; N/A ?    Comment:  Procedure: COLONOSCOPY WITH PROPOFOL;  Surgeon: VaMarius Ditch ?             RoTally DueMD;  [facility] Service:  ?  Gastroenterology;  Laterality: N/A; ?02/19/2018: LAPAROSCOPIC APPENDECTOMY; N/A ?    Comment:  Procedure: APPENDECTOMY LAPAROSCOPIC;  Surgeon: Burt Knack,  ?             Jerrol Banana, MD;  Location: [facility] ORS;  Service: General;   ?             Laterality: N/A; ?04/2017: ROTATOR CUFF REPAIR; Left ?02/2018: TRANSTHORACIC ECHOCARDIOGRAM ?    Comment:   Normal LV size with mild LVH -No RWMA.  GR 1 DD. .  EF  ?             55 to 60%.  Mild RV dilation with mild decrease function. ?             Moderately dilated aortic root (estimated 48 mm with  ?             ascending aorta estimated at 42 mm) ?02/05/5620: UMBILICAL HERNIA REPAIR; N/A ?    Comment:  Procedure: HERNIA REPAIR UMBILICAL ADULT;  Surgeon:  ?             Florene Glen, MD;  Location: [facility] ORS;  Service:  ?             General;  Laterality: N/A; ? ?BMI   ? Body Mass Index: 36.92 kg/m?  ?  ? ? Reproductive/Obstetrics ?negative OB ROS ? ?  ? ? ? ? ? ? ? ? ? ? ? ? ? ?  ?   ? ? ? ? ? ? ? ? ?Anesthesia Physical ?Anesthesia Plan ? ?ASA: 3 ? ?Anesthesia Plan: General  ? ?Post-op Pain Management:   ? ?Induction: Intravenous ? ?PONV Risk Score and Plan: Propofol infusion and TIVA ? ?Airway Management Planned: Natural Airway and Nasal Cannula ? ?Additional Equipment:  ? ?Intra-op Plan:  ? ?Post-operative Plan:  ? ?Informed Consent: I have reviewed the patients History and Physical, chart, labs and discussed the procedure including the risks, benefits and alternatives for the proposed anesthesia with the patient or authorized representative who has indicated his/her understanding and acceptance.  ? ? ? ?Dental Advisory Given ? ?Plan Discussed with: Anesthesiologist, CRNA and Surgeon ? ?Anesthesia Plan Comments: (Patient consented for risks of anesthesia including but not limited to:  ?- adverse reactions to medications ?- risk of airway placement if required ?- damage to eyes, teeth, lips or other oral mucosa ?- nerve damage due to positioning  ?- sore throat or hoarseness ?- Damage to heart, brain, nerves, lungs, other parts of body or loss of life ? ?Patient voiced understanding.)  ? ? ? ? ? ? ?Anesthesia Quick Evaluation ? ?

## 2021-10-09 ENCOUNTER — Encounter: Payer: Self-pay | Admitting: Gastroenterology

## 2021-10-09 LAB — SURGICAL PATHOLOGY

## 2021-10-30 DIAGNOSIS — M5136 Other intervertebral disc degeneration, lumbar region: Secondary | ICD-10-CM | POA: Diagnosis not present

## 2021-10-30 DIAGNOSIS — M9904 Segmental and somatic dysfunction of sacral region: Secondary | ICD-10-CM | POA: Diagnosis not present

## 2021-10-30 DIAGNOSIS — M9905 Segmental and somatic dysfunction of pelvic region: Secondary | ICD-10-CM | POA: Diagnosis not present

## 2021-10-30 DIAGNOSIS — M25651 Stiffness of right hip, not elsewhere classified: Secondary | ICD-10-CM | POA: Diagnosis not present

## 2021-11-05 DIAGNOSIS — M25651 Stiffness of right hip, not elsewhere classified: Secondary | ICD-10-CM | POA: Diagnosis not present

## 2021-11-05 DIAGNOSIS — M9905 Segmental and somatic dysfunction of pelvic region: Secondary | ICD-10-CM | POA: Diagnosis not present

## 2021-11-05 DIAGNOSIS — M9904 Segmental and somatic dysfunction of sacral region: Secondary | ICD-10-CM | POA: Diagnosis not present

## 2021-11-05 DIAGNOSIS — M5136 Other intervertebral disc degeneration, lumbar region: Secondary | ICD-10-CM | POA: Diagnosis not present

## 2021-11-06 DIAGNOSIS — M25651 Stiffness of right hip, not elsewhere classified: Secondary | ICD-10-CM | POA: Diagnosis not present

## 2021-11-06 DIAGNOSIS — M9904 Segmental and somatic dysfunction of sacral region: Secondary | ICD-10-CM | POA: Diagnosis not present

## 2021-11-06 DIAGNOSIS — M9905 Segmental and somatic dysfunction of pelvic region: Secondary | ICD-10-CM | POA: Diagnosis not present

## 2021-11-06 DIAGNOSIS — M5136 Other intervertebral disc degeneration, lumbar region: Secondary | ICD-10-CM | POA: Diagnosis not present

## 2021-11-11 DIAGNOSIS — M5136 Other intervertebral disc degeneration, lumbar region: Secondary | ICD-10-CM | POA: Diagnosis not present

## 2021-11-11 DIAGNOSIS — M9904 Segmental and somatic dysfunction of sacral region: Secondary | ICD-10-CM | POA: Diagnosis not present

## 2021-11-11 DIAGNOSIS — M9905 Segmental and somatic dysfunction of pelvic region: Secondary | ICD-10-CM | POA: Diagnosis not present

## 2021-11-11 DIAGNOSIS — M25651 Stiffness of right hip, not elsewhere classified: Secondary | ICD-10-CM | POA: Diagnosis not present

## 2021-11-20 DIAGNOSIS — M9904 Segmental and somatic dysfunction of sacral region: Secondary | ICD-10-CM | POA: Diagnosis not present

## 2021-11-20 DIAGNOSIS — M9905 Segmental and somatic dysfunction of pelvic region: Secondary | ICD-10-CM | POA: Diagnosis not present

## 2021-11-20 DIAGNOSIS — M5136 Other intervertebral disc degeneration, lumbar region: Secondary | ICD-10-CM | POA: Diagnosis not present

## 2021-11-20 DIAGNOSIS — M25651 Stiffness of right hip, not elsewhere classified: Secondary | ICD-10-CM | POA: Diagnosis not present

## 2021-12-26 DIAGNOSIS — H2513 Age-related nuclear cataract, bilateral: Secondary | ICD-10-CM | POA: Diagnosis not present

## 2022-01-09 DIAGNOSIS — I77810 Thoracic aortic ectasia: Secondary | ICD-10-CM | POA: Diagnosis not present

## 2022-01-09 DIAGNOSIS — I7121 Aneurysm of the ascending aorta, without rupture: Secondary | ICD-10-CM | POA: Diagnosis not present

## 2022-01-09 DIAGNOSIS — R0609 Other forms of dyspnea: Secondary | ICD-10-CM | POA: Diagnosis not present

## 2022-01-09 DIAGNOSIS — E785 Hyperlipidemia, unspecified: Secondary | ICD-10-CM | POA: Diagnosis not present

## 2022-01-09 DIAGNOSIS — I10 Essential (primary) hypertension: Secondary | ICD-10-CM | POA: Diagnosis not present

## 2022-01-13 ENCOUNTER — Other Ambulatory Visit: Payer: Self-pay | Admitting: Physician Assistant

## 2022-01-13 DIAGNOSIS — I7121 Aneurysm of the ascending aorta, without rupture: Secondary | ICD-10-CM

## 2022-01-13 DIAGNOSIS — I77810 Thoracic aortic ectasia: Secondary | ICD-10-CM

## 2022-01-15 ENCOUNTER — Encounter (INDEPENDENT_AMBULATORY_CARE_PROVIDER_SITE_OTHER): Payer: Self-pay

## 2022-02-13 ENCOUNTER — Ambulatory Visit
Admission: RE | Admit: 2022-02-13 | Discharge: 2022-02-13 | Disposition: A | Discharge status: Home / Self Care | Payer: Medicare Other | Source: Ambulatory Visit | Attending: Physician Assistant | Admitting: Physician Assistant

## 2022-02-13 DIAGNOSIS — R911 Solitary pulmonary nodule: Secondary | ICD-10-CM | POA: Diagnosis not present

## 2022-02-13 DIAGNOSIS — I77810 Thoracic aortic ectasia: Secondary | ICD-10-CM | POA: Diagnosis not present

## 2022-02-13 DIAGNOSIS — R0602 Shortness of breath: Secondary | ICD-10-CM | POA: Diagnosis not present

## 2022-02-13 DIAGNOSIS — I7121 Aneurysm of the ascending aorta, without rupture: Secondary | ICD-10-CM | POA: Diagnosis not present

## 2022-02-13 LAB — POCT I-STAT CREATININE: Creatinine, Ser: 0.9 mg/dL (ref 0.61–1.24)

## 2022-02-13 MED ORDER — IOHEXOL 350 MG/ML SOLN
75.0000 mL | Freq: Once | INTRAVENOUS | Status: AC | PRN
  Administered 2022-02-13: 75 mL via INTRAVENOUS

## 2022-02-26 DIAGNOSIS — J45909 Unspecified asthma, uncomplicated: Secondary | ICD-10-CM | POA: Diagnosis not present

## 2022-02-26 DIAGNOSIS — G4700 Insomnia, unspecified: Secondary | ICD-10-CM | POA: Diagnosis not present

## 2022-02-26 DIAGNOSIS — E785 Hyperlipidemia, unspecified: Secondary | ICD-10-CM | POA: Diagnosis not present

## 2022-02-26 DIAGNOSIS — R7303 Prediabetes: Secondary | ICD-10-CM | POA: Diagnosis not present

## 2022-02-26 DIAGNOSIS — K760 Fatty (change of) liver, not elsewhere classified: Secondary | ICD-10-CM | POA: Diagnosis not present

## 2022-02-26 DIAGNOSIS — M109 Gout, unspecified: Secondary | ICD-10-CM | POA: Diagnosis not present

## 2022-02-26 DIAGNOSIS — Z23 Encounter for immunization: Secondary | ICD-10-CM | POA: Diagnosis not present

## 2022-02-26 DIAGNOSIS — G4733 Obstructive sleep apnea (adult) (pediatric): Secondary | ICD-10-CM | POA: Diagnosis not present

## 2022-02-26 DIAGNOSIS — I10 Essential (primary) hypertension: Secondary | ICD-10-CM | POA: Diagnosis not present

## 2022-03-18 DIAGNOSIS — Z79899 Other long term (current) drug therapy: Secondary | ICD-10-CM | POA: Diagnosis not present

## 2022-03-18 DIAGNOSIS — I7121 Aneurysm of the ascending aorta, without rupture: Secondary | ICD-10-CM | POA: Diagnosis not present

## 2022-03-18 DIAGNOSIS — I351 Nonrheumatic aortic (valve) insufficiency: Secondary | ICD-10-CM | POA: Diagnosis not present

## 2022-03-18 DIAGNOSIS — I10 Essential (primary) hypertension: Secondary | ICD-10-CM | POA: Diagnosis not present

## 2022-03-18 DIAGNOSIS — E785 Hyperlipidemia, unspecified: Secondary | ICD-10-CM | POA: Diagnosis not present

## 2022-03-18 DIAGNOSIS — Z7982 Long term (current) use of aspirin: Secondary | ICD-10-CM | POA: Diagnosis not present

## 2022-03-18 DIAGNOSIS — Z9049 Acquired absence of other specified parts of digestive tract: Secondary | ICD-10-CM | POA: Diagnosis not present

## 2022-03-18 DIAGNOSIS — R0609 Other forms of dyspnea: Secondary | ICD-10-CM | POA: Diagnosis not present

## 2022-03-18 DIAGNOSIS — R0602 Shortness of breath: Secondary | ICD-10-CM | POA: Diagnosis not present

## 2022-03-18 DIAGNOSIS — R0789 Other chest pain: Secondary | ICD-10-CM | POA: Diagnosis not present

## 2022-07-29 DIAGNOSIS — M25551 Pain in right hip: Secondary | ICD-10-CM | POA: Diagnosis not present

## 2022-08-14 DIAGNOSIS — M25551 Pain in right hip: Secondary | ICD-10-CM | POA: Diagnosis not present

## 2022-08-14 DIAGNOSIS — M545 Low back pain, unspecified: Secondary | ICD-10-CM | POA: Diagnosis not present

## 2022-08-28 DIAGNOSIS — Z139 Encounter for screening, unspecified: Secondary | ICD-10-CM | POA: Diagnosis not present

## 2022-08-28 DIAGNOSIS — G4733 Obstructive sleep apnea (adult) (pediatric): Secondary | ICD-10-CM | POA: Diagnosis not present

## 2022-08-28 DIAGNOSIS — T23109A Burn of first degree of unspecified hand, unspecified site, initial encounter: Secondary | ICD-10-CM | POA: Diagnosis not present

## 2022-08-28 DIAGNOSIS — R7303 Prediabetes: Secondary | ICD-10-CM | POA: Diagnosis not present

## 2022-08-28 DIAGNOSIS — K760 Fatty (change of) liver, not elsewhere classified: Secondary | ICD-10-CM | POA: Diagnosis not present

## 2022-08-28 DIAGNOSIS — E785 Hyperlipidemia, unspecified: Secondary | ICD-10-CM | POA: Diagnosis not present

## 2022-08-28 DIAGNOSIS — G4700 Insomnia, unspecified: Secondary | ICD-10-CM | POA: Diagnosis not present

## 2022-08-28 DIAGNOSIS — I10 Essential (primary) hypertension: Secondary | ICD-10-CM | POA: Diagnosis not present

## 2022-09-11 DIAGNOSIS — M25551 Pain in right hip: Secondary | ICD-10-CM | POA: Diagnosis not present

## 2022-10-14 ENCOUNTER — Ambulatory Visit (HOSPITAL_COMMUNITY): Payer: Self-pay | Admitting: Emergency Medicine

## 2022-10-14 DIAGNOSIS — G8929 Other chronic pain: Secondary | ICD-10-CM

## 2022-10-14 DIAGNOSIS — M1611 Unilateral primary osteoarthritis, right hip: Secondary | ICD-10-CM | POA: Diagnosis not present

## 2022-10-14 NOTE — H&P (Signed)
TOTAL HIP ADMISSION H&P  Patient is admitted for right total hip arthroplasty.  Subjective:  Chief Complaint: right hip pain  HPI: Richard Gallagher, 67 y.o. male, has a history of pain and functional disability in the right hip(s) due to arthritis and patient has failed non-surgical conservative treatments for greater than 12 weeks to include NSAID's and/or analgesics, use of assistive devices, and activity modification.  Onset of symptoms was gradual starting 5 years ago with gradually worsening course since that time.The patient noted no past surgery on the right hip(s).  Patient currently rates pain in the right hip at 10 out of 10 with activity. Patient has night pain, worsening of pain with activity and weight bearing, pain that interfers with activities of daily living, and pain with passive range of motion. Patient has evidence of joint space narrowing and osteophytosis by imaging studies. This condition presents safety issues increasing the risk of falls.  There is no current active infection.  Patient Active Problem List   Diagnosis Date Noted   History of colonic polyps    Polyp of cecum    Polyp of ascending colon    Dyslipidemia (high LDL; low HDL) 02/18/2019   Umbilical hernia without obstruction and without gangrene    Tubular adenoma of colon    Pre-operative cardiovascular examination 02/04/2017   DOE (dyspnea on exertion) 02/04/2017   Thoracic aortic aneurysm without rupture (HCC) 02/04/2017   Essential hypertension 02/04/2017   Past Medical History:  Diagnosis Date   Acute appendicitis    Aortic atherosclerosis (HCC)    Arthritis    Collagen vascular disease (HCC)    COPD (chronic obstructive pulmonary disease) (HCC)    Dilated aortic root (HCC)    Dysrhythmia    right bundle branch block...(not new)   Essential hypertension 02/04/2017   Fatty liver    Headache    takes atenolol for this   Hyperglycemia    Moderate obesity    Had been better controlled while he  was living in [name] He gained 30 pounds since returning in January 2018   Myocardial infarction [facility]) 2016   lower right hand side of heart...no damage done   Right bundle branch block (RBBB) on electrocardiogram (ECG) 05/2016   Ruptured appendicitis 06/20/2017   Sleep apnea    NO CPAP.  instructed years ago to use. lost weight   Thoracic aortic aneurysm [facility])    Thoracic aortic aneurysm without rupture (HCC) 05/2016   a. noted on Cardiac Cath with TAA Angio - Aortic Root 5.1 cm. b. echo 02/2017 - dilated aortic root (46mm) and ascending aorta (43mm).    Past Surgical History:  Procedure Laterality Date   BACK SURGERY  1995   LOWER   CARDIAC CATHETERIZATION  05/2016    [facility] [place], [name] Dr. Harrell Lark) 05/22/16: EF 55%. Normal coronary arteries. Dilated aortic root at 5.1 cm. 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 codominant   COLONOSCOPY WITH PROPOFOL N/A 08/04/2017   Procedure: COLONOSCOPY WITH PROPOFOL;  Surgeon: Toney Reil, MD;  Location: [facility] ENDOSCOPY;  Service: Gastroenterology;  Laterality: N/A;   COLONOSCOPY WITH PROPOFOL N/A 01/11/2018   Procedure: COLONOSCOPY WITH PROPOFOL;  Surgeon: Toney Reil, MD;  Location: [facility] ENDOSCOPY;  Service: Gastroenterology;  Laterality: N/A;   COLONOSCOPY WITH PROPOFOL N/A 10/08/2021   Procedure: COLONOSCOPY WITH PROPOFOL;  Surgeon: Toney Reil, MD;  Location: [facility] ENDOSCOPY;  Service: Gastroenterology;  Laterality: N/A;   LAPAROSCOPIC APPENDECTOMY N/A 02/19/2018  Procedure: APPENDECTOMY LAPAROSCOPIC;  Surgeon: Lattie Haw, MD;  Location: [facility] ORS;  Service: General;  Laterality: N/A;   ROTATOR CUFF REPAIR Left 04/2017   TRANSTHORACIC ECHOCARDIOGRAM  02/2018    Normal LV size with mild LVH -No RWMA.  GR 1 DD. Marland Kitchen  EF 55 to 60%.  Mild RV dilation with mild decrease function.  Moderately dilated aortic root (estimated 48 mm with ascending aorta estimated at 42 mm)   UMBILICAL HERNIA  REPAIR N/A 02/19/2018   Procedure: HERNIA REPAIR UMBILICAL ADULT;  Surgeon: Lattie Haw, MD;  Location: [facility] ORS;  Service: General;  Laterality: N/A;    Current Outpatient Medications  Medication Sig Dispense Refill Last Dose   amLODipine (NORVASC) 5 MG tablet Take 5 mg by mouth daily.      aspirin EC 81 MG tablet Take 81 mg by mouth daily.      atenolol (TENORMIN) 50 MG tablet Take 50 mg by mouth every morning.   0    gabapentin (NEURONTIN) 300 MG capsule Take 300-600 mg by mouth at bedtime.      meloxicam (MOBIC) 15 MG tablet Take 15 mg by mouth daily.      triamcinolone cream (KENALOG) 0.1 % Apply 1 application topically 2 (two) times daily as needed (itching).   1    zolpidem (AMBIEN) 5 MG tablet Take 5 mg by mouth at bedtime as needed for sleep.   1    No current facility-administered medications for this visit.   No Known Allergies  Social History   Tobacco Use   Smoking status: Never   Smokeless tobacco: Never  Substance Use Topics   Alcohol use: Yes    Comment:  moderate use    Family History  Problem Relation Age of Onset   Hypertension Mother    Diabetes Mother    Heart disease Mother        He is not sure of details   Arthritis Mother    Hypertension Father    Diabetes Father    Arthritis Father    Stroke Brother      Review of Systems  Musculoskeletal:  Positive for arthralgias.  All other systems reviewed and are negative.   Objective:  Physical Exam Constitutional:      General: He is not in acute distress.    Appearance: Normal appearance. He is normal weight.  HENT:     Head: Normocephalic and atraumatic.  Eyes:     Extraocular Movements: Extraocular movements intact.     Conjunctiva/sclera: Conjunctivae normal.     Pupils: Pupils are equal, round, and reactive to light.  Cardiovascular:     Rate and Rhythm: Normal rate and regular rhythm.     Pulses: Normal pulses.     Heart sounds: Normal heart sounds.  Pulmonary:     Effort:  Pulmonary effort is normal. No respiratory distress.     Breath sounds: Normal breath sounds.  Abdominal:     General: Bowel sounds are normal. There is no distension.     Palpations: Abdomen is soft.     Tenderness: There is no abdominal tenderness.  Musculoskeletal:        General: Tenderness present.     Cervical back: Normal range of motion and neck supple.     Comments: TTP of right trochanteric bursa and groin region.  Decreased strength of RLE and ROM of RLE due to pain.  BLE appear grossly neurovascularly intact.  No lesion on area of chief complaint.  Gait mildly antalgic.  Lymphadenopathy:     Cervical: No cervical adenopathy.  Skin:    General: Skin is warm and dry.     Capillary Refill: Capillary refill takes less than 2 seconds.     Findings: No erythema or rash.  Neurological:     General: No focal deficit present.     Mental Status: He is alert and oriented to person, place, and time.  Psychiatric:        Mood and Affect: Mood normal.        Behavior: Behavior normal.     Vital signs in last 24 hours: @VSRANGES @  Labs:   Estimated body mass index is 36.92 kg/m as calculated from the following:   Height as of 10/08/21: 5\' 9"  (1.753 m).   Weight as of 10/08/21: 113.4 kg.   Imaging Review Plain radiographs demonstrate severe degenerative joint disease of the right hip(s). The bone quality appears to be good for age and reported activity level.      Assessment/Plan:  End stage arthritis, right hip(s)  The patient history, physical examination, clinical judgement of the provider and imaging studies are consistent with end stage degenerative joint disease of the right hip(s) and total hip arthroplasty is deemed medically necessary. The treatment options including medical management, injection therapy, arthroscopy and arthroplasty were discussed at length. The risks and benefits of total hip arthroplasty were presented and reviewed. The risks due to aseptic  loosening, infection, stiffness, dislocation/subluxation,  thromboembolic complications and other imponderables were discussed.  The patient acknowledged the explanation, agreed to proceed with the plan and consent was signed. Patient is being admitted for inpatient treatment for surgery, pain control, PT, OT, prophylactic antibiotics, VTE prophylaxis, progressive ambulation and ADL's and discharge planning.The patient is planning to be discharged  home with outpatient PT    Patient's anticipated LOS is less than 2 midnights, meeting these requirements: - Younger than 69 - Lives within 1 hour of care - Has a competent adult at home to recover with post-op recover - NO history of  - Chronic pain requiring opiods  - Diabetes  - Coronary Artery Disease  - Heart failure  - Heart attack  - Stroke  - DVT/VTE  - Cardiac arrhythmia  - Respiratory Failure/COPD  - Renal failure  - Anemia  - Advanced Liver disease

## 2022-10-14 NOTE — H&P (View-Only) (Signed)
TOTAL HIP ADMISSION H&P  Patient is admitted for right total hip arthroplasty.  Subjective:  Chief Complaint: right hip pain  HPI: Richard Gallagher, 67 y.o. male, has a history of pain and functional disability in the right hip(s) due to arthritis and patient has failed non-surgical conservative treatments for greater than 12 weeks to include NSAID's and/or analgesics, use of assistive devices, and activity modification.  Onset of symptoms was gradual starting 5 years ago with gradually worsening course since that time.The patient noted no past surgery on the right hip(s).  Patient currently rates pain in the right hip at 10 out of 10 with activity. Patient has night pain, worsening of pain with activity and weight bearing, pain that interfers with activities of daily living, and pain with passive range of motion. Patient has evidence of joint space narrowing and osteophytosis by imaging studies. This condition presents safety issues increasing the risk of falls.  There is no current active infection.  Patient Active Problem List   Diagnosis Date Noted   History of colonic polyps    Polyp of cecum    Polyp of ascending colon    Dyslipidemia (high LDL; low HDL) 02/18/2019   Umbilical hernia without obstruction and without gangrene    Tubular adenoma of colon    Pre-operative cardiovascular examination 02/04/2017   DOE (dyspnea on exertion) 02/04/2017   Thoracic aortic aneurysm without rupture (HCC) 02/04/2017   Essential hypertension 02/04/2017   Past Medical History:  Diagnosis Date   Acute appendicitis    Aortic atherosclerosis (HCC)    Arthritis    Collagen vascular disease (HCC)    COPD (chronic obstructive pulmonary disease) (HCC)    Dilated aortic root (HCC)    Dysrhythmia    right bundle branch block...(not new)   Essential hypertension 02/04/2017   Fatty liver    Headache    takes atenolol for this   Hyperglycemia    Moderate obesity    Had been better controlled while he  was living in [place]. He gained 30 pounds since returning in January 2018   Myocardial infarction (HCC) 2016   lower right hand side of heart...no damage done   Right bundle branch block (RBBB) on electrocardiogram (ECG) 05/2016   Ruptured appendicitis 06/20/2017   Sleep apnea    NO CPAP.  instructed years ago to use. lost weight   Thoracic aortic aneurysm (HCC)    Thoracic aortic aneurysm without rupture (HCC) 05/2016   a. noted on Cardiac Cath with TAA Angio - Aortic Root 5.1 cm. b. echo 02/2017 - dilated aortic root (46mm) and ascending aorta (43mm).    Past Surgical History:  Procedure Laterality Date   BACK SURGERY  1995   LOWER   CARDIAC CATHETERIZATION  05/2016    ([place], [place]. Dr. Robert Gonzalez Fernandez) 05/22/16: EF 55%. Normal coronary arteries. Dilated aortic root at 5.1 cm. 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 codominant   COLONOSCOPY WITH PROPOFOL N/A 08/04/2017   Procedure: COLONOSCOPY WITH PROPOFOL;  Surgeon: Vanga, Rohini Reddy, MD;  Location: [facility] ENDOSCOPY;  Service: Gastroenterology;  Laterality: N/A;   COLONOSCOPY WITH PROPOFOL N/A 01/11/2018   Procedure: COLONOSCOPY WITH PROPOFOL;  Surgeon: Vanga, Rohini Reddy, MD;  Location: [facility] ENDOSCOPY;  Service: Gastroenterology;  Laterality: N/A;   COLONOSCOPY WITH PROPOFOL N/A 10/08/2021   Procedure: COLONOSCOPY WITH PROPOFOL;  Surgeon: Vanga, Rohini Reddy, MD;  Location: [facility] ENDOSCOPY;  Service: Gastroenterology;  Laterality: N/A;   LAPAROSCOPIC APPENDECTOMY N/A 02/19/2018     Procedure: APPENDECTOMY LAPAROSCOPIC;  Surgeon: Cooper, Richard E, MD;  Location: [facility] ORS;  Service: General;  Laterality: N/A;   ROTATOR CUFF REPAIR Left 04/2017   TRANSTHORACIC ECHOCARDIOGRAM  02/2018    Normal LV size with mild LVH -No RWMA.  GR 1 DD. .  EF 55 to 60%.  Mild RV dilation with mild decrease function.  Moderately dilated aortic root (estimated 48 mm with ascending aorta estimated at 42 mm)   UMBILICAL HERNIA  REPAIR N/A 02/19/2018   Procedure: HERNIA REPAIR UMBILICAL ADULT;  Surgeon: Cooper, Richard E, MD;  Location: [facility] ORS;  Service: General;  Laterality: N/A;    Current Outpatient Medications  Medication Sig Dispense Refill Last Dose   amLODipine (NORVASC) 5 MG tablet Take 5 mg by mouth daily.      aspirin EC 81 MG tablet Take 81 mg by mouth daily.      atenolol (TENORMIN) 50 MG tablet Take 50 mg by mouth every morning.   0    gabapentin (NEURONTIN) 300 MG capsule Take 300-600 mg by mouth at bedtime.      meloxicam (MOBIC) 15 MG tablet Take 15 mg by mouth daily.      triamcinolone cream (KENALOG) 0.1 % Apply 1 application topically 2 (two) times daily as needed (itching).   1    zolpidem (AMBIEN) 5 MG tablet Take 5 mg by mouth at bedtime as needed for sleep.   1    No current facility-administered medications for this visit.   No Known Allergies  Social History   Tobacco Use   Smoking status: Never   Smokeless tobacco: Never  Substance Use Topics   Alcohol use: Yes    Comment:  moderate use    Family History  Problem Relation Age of Onset   Hypertension Mother    Diabetes Mother    Heart disease Mother        He is not sure of details   Arthritis Mother    Hypertension Father    Diabetes Father    Arthritis Father    Stroke Brother      Review of Systems  Musculoskeletal:  Positive for arthralgias.  All other systems reviewed and are negative.   Objective:  Physical Exam Constitutional:      General: He is not in acute distress.    Appearance: Normal appearance. He is normal weight.  HENT:     Head: Normocephalic and atraumatic.  Eyes:     Extraocular Movements: Extraocular movements intact.     Conjunctiva/sclera: Conjunctivae normal.     Pupils: Pupils are equal, round, and reactive to light.  Cardiovascular:     Rate and Rhythm: Normal rate and regular rhythm.     Pulses: Normal pulses.     Heart sounds: Normal heart sounds.  Pulmonary:     Effort:  Pulmonary effort is normal. No respiratory distress.     Breath sounds: Normal breath sounds.  Abdominal:     General: Bowel sounds are normal. There is no distension.     Palpations: Abdomen is soft.     Tenderness: There is no abdominal tenderness.  Musculoskeletal:        General: Tenderness present.     Cervical back: Normal range of motion and neck supple.     Comments: TTP of right trochanteric bursa and groin region.  Decreased strength of RLE and ROM of RLE due to pain.  BLE appear grossly neurovascularly intact.  No lesion on area of chief complaint.    Gait mildly antalgic.  Lymphadenopathy:     Cervical: No cervical adenopathy.  Skin:    General: Skin is warm and dry.     Capillary Refill: Capillary refill takes less than 2 seconds.     Findings: No erythema or rash.  Neurological:     General: No focal deficit present.     Mental Status: He is alert and oriented to person, place, and time.  Psychiatric:        Mood and Affect: Mood normal.        Behavior: Behavior normal.     Vital signs in last 24 hours: @VSRANGES@  Labs:   Estimated body mass index is 36.92 kg/m as calculated from the following:   Height as of 10/08/21: 5' 9" (1.753 m).   Weight as of 10/08/21: 113.4 kg.   Imaging Review Plain radiographs demonstrate severe degenerative joint disease of the right hip(s). The bone quality appears to be good for age and reported activity level.      Assessment/Plan:  End stage arthritis, right hip(s)  The patient history, physical examination, clinical judgement of the provider and imaging studies are consistent with end stage degenerative joint disease of the right hip(s) and total hip arthroplasty is deemed medically necessary. The treatment options including medical management, injection therapy, arthroscopy and arthroplasty were discussed at length. The risks and benefits of total hip arthroplasty were presented and reviewed. The risks due to aseptic  loosening, infection, stiffness, dislocation/subluxation,  thromboembolic complications and other imponderables were discussed.  The patient acknowledged the explanation, agreed to proceed with the plan and consent was signed. Patient is being admitted for inpatient treatment for surgery, pain control, PT, OT, prophylactic antibiotics, VTE prophylaxis, progressive ambulation and ADL's and discharge planning.The patient is planning to be discharged  home with outpatient PT    Patient's anticipated LOS is less than 2 midnights, meeting these requirements: - Younger than 65 - Lives within 1 hour of care - Has a competent adult at home to recover with post-op recover - NO history of  - Chronic pain requiring opiods  - Diabetes  - Coronary Artery Disease  - Heart failure  - Heart attack  - Stroke  - DVT/VTE  - Cardiac arrhythmia  - Respiratory Failure/COPD  - Renal failure  - Anemia  - Advanced Liver disease   

## 2022-10-20 NOTE — Care Plan (Signed)
Ortho Bundle Case Management Note  Patient Details  Name: Richard Gallagher MRN: [id number] Date of Birth: [birth year]  met with patient and wife in the office for H&P. he will discharge to home with family to assist. has all equipment. OPPT set up with [facility] . discharge instructions discussed and questions answered. Patient and MD in agreement. Choice offered. Anticipate an  overnight stay                     DME Arranged:    DME Agency:     HH Arranged:    HH Agency:     Additional Comments: Please contact me with any questions of if this plan should need to change.  Shauna Hugh,  RN,BSN,MHA,CCM  [facility]ist  [phone] 10/20/2022, 11:38 AM

## 2022-10-23 NOTE — Patient Instructions (Addendum)
SURGICAL WAITING ROOM VISITATION  Patients having surgery or a procedure may have no more than 2 support people in the waiting area - these visitors may rotate.    Children under the age of 74 must have an adult with them who is not the patient.  Due to an increase in RSV and influenza rates and associated hospitalizations, children ages 47 and under may not visit patients in [facility] hospitals.  If the patient needs to stay at the hospital during part of their recovery, the visitor guidelines for inpatient rooms apply. Pre-op nurse will coordinate an appropriate time for 1 support person to accompany patient in pre-op.  This support person may not rotate.    Please refer to the [facility] website for the visitor guidelines for Inpatients (after your surgery is over and you are in a regular room).    Your procedure is scheduled on: Nov 05, 2022   Report to [facility] Main Entrance    Report to admitting at 8 AM   Call this number if you have problems the morning of surgery [phone]   Do not eat food :After Midnight.   After Midnight you may have the following liquids until7:30 AM DAY OF SURGERY  Water Non-Citrus Juices (without pulp, NO RED-Apple, White grape, White cranberry) Black Coffee (NO MILK/CREAM OR CREAMERS, sugar ok)  Clear Tea (NO MILK/CREAM OR CREAMERS, sugar ok) regular and decaf                             Plain Jell-O (NO RED)                                           Fruit ices (not with fruit pulp, NO RED)                                     Popsicles (NO RED)                                                               Sports drinks like Gatorade (NO RED)                The day of surgery:  Drink ONE (1) Pre-Surgery Clear Ensure at  7:30 AM the morning of surgery. Drink in one sitting. Do not sip.  This drink was given to you during your hospital  pre-op appointment visit. Nothing else to drink after completing the  Pre-Surgery Clear Ensure  or G2.          If you have questions, please contact your surgeon's office.   FOLLOW BOWEL PREP AND ANY ADDITIONAL PRE OP INSTRUCTIONS YOU RECEIVED FROM YOUR SURGEON'S OFFICE!!!     Oral Hygiene is also important to reduce your risk of infection.                                    Remember - BRUSH YOUR TEETH THE MORNING OF SURGERY WITH YOUR REGULAR TOOTHPASTE  DENTURES WILL BE REMOVED PRIOR TO SURGERY PLEASE DO NOT APPLY "Poly grip" OR ADHESIVES!!!   Do NOT smoke after Midnight   Take these medicines the morning of surgery with A SIP OF WATER: Amlodipine, Atenolol   Bring CPAP mask and tubing day of surgery.                              You may not have any metal on your body including hair pins, jewelry, and body piercing             Do not wear make-up, lotions, powders, cologne, or deodorant  Do not shave  48 hours prior to surgery.               Men may shave face and neck.   Do not bring valuables to the hospital. [facility] IS NOT             RESPONSIBLE   FOR VALUABLES.   Contacts, glasses, dentures or bridgework may not be worn into surgery.   Bring small overnight bag day of surgery.   DO NOT BRING YOUR HOME MEDICATIONS TO THE HOSPITAL. PHARMACY WILL DISPENSE MEDICATIONS LISTED ON YOUR MEDICATION LIST TO YOU DURING YOUR ADMISSION IN THE HOSPITAL!    Patients discharged on the day of surgery will not be allowed to drive home.  Someone NEEDS to stay with you for the first 24 hours after anesthesia.   Special Instructions: Bring a copy of your healthcare power of attorney and living will documents the day of surgery if you haven't scanned them before.              Please read over the following fact sheets you were given: IF YOU HAVE QUESTIONS ABOUT YOUR PRE-OP INSTRUCTIONS PLEASE CALL [phone] Contras   If you received a COVID test during your pre-op visit  it is requested that you wear a mask when out in public, stay away from anyone that may not be feeling  well and notify your surgeon if you develop symptoms. If you test positive for Covid or have been in contact with anyone that has tested positive in the last 10 days please notify you surgeon.    Pre-operative 5 CHG Bath Instructions   You can play a key role in reducing the risk of infection after surgery. Your skin needs to be as free of germs as possible. You can reduce the number of germs on your skin by washing with CHG (chlorhexidine gluconate) soap before surgery. CHG is an antiseptic soap that kills germs and continues to kill germs even after washing.   DO NOT use if you have an allergy to chlorhexidine/CHG or antibacterial soaps. If your skin becomes reddened or irritated, stop using the CHG and notify one of our RNs at [phone].   Please shower with the CHG soap starting 4 days before surgery using the following schedule:     Please keep in mind the following:  DO NOT shave, including legs and underarms, starting the day of your first shower.   You may shave your face at any point before/day of surgery.  Place clean sheets on your bed the day you start using CHG soap. Use a clean washcloth (not used since being washed) for each shower. DO NOT sleep with pets once you start using the CHG.   CHG Shower Instructions:  If you choose to wash your hair and private area, wash  first with your normal shampoo/soap.  After you use shampoo/soap, rinse your hair and body thoroughly to remove shampoo/soap residue.  Turn the water OFF and apply about 3 tablespoons (45 ml) of CHG soap to a CLEAN washcloth.  Apply CHG soap ONLY FROM YOUR NECK DOWN TO YOUR TOES (washing for 3-5 minutes)  DO NOT use CHG soap on face, private areas, open wounds, or sores.  Pay special attention to the area where your surgery is being performed.  If you are having back surgery, having someone wash your back for you may be helpful. Wait 2 minutes after CHG soap is applied, then you may rinse off the CHG soap.   Pat dry with a clean towel  Put on clean clothes/pajamas   If you choose to wear lotion, please use ONLY the CHG-compatible lotions on the back of this paper.     Additional instructions for the day of surgery: DO NOT APPLY any lotions, deodorants, cologne, or perfumes.   Put on clean/comfortable clothes.  Brush your teeth.  Ask your nurse before applying any prescription medications to the skin.      CHG Compatible Lotions   Aveeno Moisturizing lotion  Cetaphil Moisturizing Cream  Cetaphil Moisturizing Lotion  Clairol Herbal Essence Moisturizing Lotion, Dry Skin  Clairol Herbal Essence Moisturizing Lotion, Extra Dry Skin  Clairol Herbal Essence Moisturizing Lotion, Normal Skin  Curel Age Defying Therapeutic Moisturizing Lotion with Alpha Hydroxy  Curel Extreme Care Body Lotion  Curel Soothing Hands Moisturizing Hand Lotion  Curel Therapeutic Moisturizing Cream, Fragrance-Free  Curel Therapeutic Moisturizing Lotion, Fragrance-Free  Curel Therapeutic Moisturizing Lotion, Original Formula  Eucerin Daily Replenishing Lotion  Eucerin Dry Skin Therapy Plus Alpha Hydroxy Crme  Eucerin Dry Skin Therapy Plus Alpha Hydroxy Lotion  Eucerin Original Crme  Eucerin Original Lotion  Eucerin Plus Crme Eucerin Plus Lotion  Eucerin TriLipid Replenishing Lotion  Keri Anti-Bacterial Hand Lotion  Keri Deep Conditioning Original Lotion Dry Skin Formula Softly Scented  Keri Deep Conditioning Original Lotion, Fragrance Free Sensitive Skin Formula  Keri Lotion Fast Absorbing Fragrance Free Sensitive Skin Formula  Keri Lotion Fast Absorbing Softly Scented Dry Skin Formula  Keri Original Lotion  Keri Skin Renewal Lotion Keri Silky Smooth Lotion  Keri Silky Smooth Sensitive Skin Lotion  Nivea Body Creamy Conditioning Oil  Nivea Body Extra Enriched Teacher, adult education Moisturizing Lotion Nivea Crme  Nivea Skin Firming Lotion  NutraDerm 30 Skin Lotion   NutraDerm Skin Lotion  NutraDerm Therapeutic Skin Cream  NutraDerm Therapeutic Skin Lotion  ProShield Protective Hand Cream  Provon moisturizing lotion  WHAT IS A BLOOD TRANSFUSION? Blood Transfusion Information  A transfusion is the replacement of blood or some of its parts. Blood is made up of multiple cells which provide different functions. Red blood cells carry oxygen and are used for blood loss replacement. White blood cells fight against infection. Platelets control bleeding. Plasma helps clot blood. Other blood products are available for specialized needs, such as hemophilia or other clotting disorders. BEFORE THE TRANSFUSION  Who gives blood for transfusions?  Healthy volunteers who are fully evaluated to make sure their blood is safe. This is blood bank blood. Transfusion therapy is the safest it has ever been in the practice of medicine. Before blood is taken from a donor, a complete history is taken to make sure that person has no history of diseases nor engages in risky social behavior (examples are intravenous drug use or sexual activity with multiple  partners). The donor's travel history is screened to minimize risk of transmitting infections, such as malaria. The donated blood is tested for signs of infectious diseases, such as HIV and hepatitis. The blood is then tested to be sure it is compatible with you in order to minimize the chance of a transfusion reaction. If you or a relative donates blood, this is often done in anticipation of surgery and is not appropriate for emergency situations. It takes many days to process the donated blood. RISKS AND COMPLICATIONS Although transfusion therapy is very safe and saves many lives, the main dangers of transfusion include:  Getting an infectious disease. Developing a transfusion reaction. This is an allergic reaction to something in the blood you were given. Every precaution is taken to prevent this. The decision to have a blood  transfusion has been considered carefully by your caregiver before blood is given. Blood is not given unless the benefits outweigh the risks. AFTER THE TRANSFUSION Right after receiving a blood transfusion, you will usually feel much better and more energetic. This is especially true if your red blood cells have gotten low (anemic). The transfusion raises the level of the red blood cells which carry oxygen, and this usually causes an energy increase. The nurse administering the transfusion will monitor you carefully for complications. HOME CARE INSTRUCTIONS  No special instructions are needed after a transfusion. You may find your energy is better. Speak with your caregiver about any limitations on activity for underlying diseases you may have. SEEK MEDICAL CARE IF:  Your condition is not improving after your transfusion. You develop redness or irritation at the intravenous (IV) site. SEEK IMMEDIATE MEDICAL CARE IF:  Any of the following symptoms occur over the next 12 hours: Shaking chills. You have a temperature by mouth above 102 F (38.9 C), not controlled by medicine. Chest, back, or muscle pain. People around you feel you are not acting correctly or are confused. Shortness of breath or difficulty breathing. Dizziness and fainting. You get a rash or develop hives. You have a decrease in urine output. Your urine turns a dark color or changes to pink, red, or brown. Any of the following symptoms occur over the next 10 days: You have a temperature by mouth above 102 F (38.9 C), not controlled by medicine. Shortness of breath. Weakness after normal activity. The white part of the eye turns yellow (jaundice). You have a decrease in the amount of urine or are urinating less often. Your urine turns a dark color or changes to pink, red, or brown. Document Released: 05/23/2000 Document Revised: 08/18/2011 Document Reviewed: 01/10/2008 [facility] Patient Information 2014 [place], [place].

## 2022-10-23 NOTE — Progress Notes (Addendum)
COVID Vaccine received:  []  No [x]  Yes Date of any COVID positive Test in last 23 days:no  PCP - Lonie Peak MD Cardiologist -Dwyane Dee Cardiac Clearance-Adam WilliamsMD-09/17/22  Medical Clearance-Nathan ConroyMD 09/30/22  Chest x-ray - 03/22/18 [facility] EKG - 10/24/22 [facility] Stress Test - 2017 ECHO -03/18/22 [facility] Cardiac Cath - 05/22/16 EPIC CT ANGIO 02/13/22 EPIC  Bowel Prep - [x]  No  []   Yes ______  Pacemaker / ICD device [x]  No []  Yes   Spinal Cord Stimulator:[x]  No []  Yes       History of Sleep Apnea? []  No [x]  Yes   CPAP used?- []  No [x]  Yes    Does the patient monitor blood sugar?          [x]  No []  Yes  []  N/A  Patient has: []  NO Hx DM   [x]  Pre-DM                 []  DM1  []   DM2 Does patient have a [employer] or Dexacom? [x]  No []  Yes   Fasting Blood Sugar Ranges-  Checks Blood Sugar _____ times a day  GLP1 agonist / usual dose - N/A GLP1 instructions:  SGLT-2 inhibitors / usual dose - N/A SGLT-2 instructions:   Blood Thinner / Instructions: Aspirin Instructions:81mg  ASA . Pt. Holding 7 days.  Comments:   Activity level: Patient is able to climb a flight of stairs without difficulty; [x]  No CP  [x]  No SOB, but would have _hip pain__   Patient can  perform ADLs without assistance.   Anesthesia review: HTN, Heart attack, R BBB, COPD, Sleep apnea, Abnormal EKG  Patient denies shortness of breath, fever, cough and chest pain at PAT appointment.  Patient verbalized understanding and agreement to the Pre-Surgical Instructions that were given to them at this PAT appointment. Patient was also educated of the need to review these PAT instructions again prior to his/her surgery.I reviewed the appropriate phone numbers to call if they have any and questions or concerns.

## 2022-10-23 NOTE — Progress Notes (Signed)
COVID Vaccine received:  []  No [x]  Yes Date of any COVID positive Test in last 90 days:   PCP - Burnell Blanks MD Cardiologist - Bryan Lemma MD Cardiac Clearance-Adam WilliamsMD-09/17/22   Medical Clearance-Nathan ConroyMD 09/30/22   Chest x-ray - 03/22/18 [facility] EKG -  01/03/21 [facility] Stress Test -  ECHO -03/18/22 [facility] Cardiac Cath - 05/22/16 EPIC CT ANGIO 02/13/22 EPIC   Bowel Prep - []  No  []   Yes ______   Pacemaker / ICD device []  No []  Yes   Spinal Cord Stimulator:[]  No []  Yes       History of Sleep Apnea? []  No [x]  Yes   CPAP used?- []  No []  Yes     Does the patient monitor blood sugar?          []  No []  Yes  []  N/A   Patient has: []  NO Hx DM   []  Pre-DM                 []  DM1  []   DM2 Does patient have a [employer] or Dex[employer]? []  No []  Yes   Fasting Blood Sugar Ranges-  Checks Blood Sugar _____ times a day   GLP1 agonist / usual dose -  GLP1 instructions:  SGLT-2 inhibitors / usual dose -  SGLT-2 instructions:    Blood Thinner / Instructions: Aspirin Instructions:   Comments:    Activity level: Patient is able / unable to climb a flight of stairs without difficulty; []  No CP  []  No SOB, but would have ___   Patient can / can not perform ADLs without assistance.    Anesthesia review:    Patient denies shortness of breath, fever, cough and chest pain at PAT appointment.   Patient verbalized understanding and agreement to the Pre-Surgical Instructions that were given to them at this PAT appointment. Patient was also educated of the need to review these PAT instructions again prior to his/her surgery.I reviewed the appropriate phone numbers to call if they have any and questions or concerns

## 2022-10-24 ENCOUNTER — Encounter (HOSPITAL_COMMUNITY): Payer: Self-pay

## 2022-10-24 ENCOUNTER — Other Ambulatory Visit: Payer: Self-pay

## 2022-10-24 ENCOUNTER — Encounter (HOSPITAL_COMMUNITY)
Admission: RE | Admit: 2022-10-24 | Discharge: 2022-10-24 | Disposition: A | Discharge status: Home / Self Care | Payer: Medicare Other | Source: Ambulatory Visit | Attending: Orthopedic Surgery | Admitting: Orthopedic Surgery

## 2022-10-24 VITALS — BP 139/79 | HR 48 | Resp 18 | Ht 69.0 in | Wt 247.0 lb

## 2022-10-24 DIAGNOSIS — M25551 Pain in right hip: Secondary | ICD-10-CM | POA: Diagnosis not present

## 2022-10-24 DIAGNOSIS — G8929 Other chronic pain: Secondary | ICD-10-CM | POA: Diagnosis not present

## 2022-10-24 DIAGNOSIS — I10 Essential (primary) hypertension: Secondary | ICD-10-CM

## 2022-10-24 DIAGNOSIS — Z01818 Encounter for other preprocedural examination: Secondary | ICD-10-CM

## 2022-10-24 DIAGNOSIS — I712 Thoracic aortic aneurysm, without rupture, unspecified: Secondary | ICD-10-CM | POA: Insufficient documentation

## 2022-10-24 DIAGNOSIS — R7303 Prediabetes: Secondary | ICD-10-CM | POA: Insufficient documentation

## 2022-10-24 DIAGNOSIS — R0609 Other forms of dyspnea: Secondary | ICD-10-CM | POA: Diagnosis not present

## 2022-10-24 LAB — SURGICAL PCR SCREEN
MRSA, PCR: NEGATIVE
Staphylococcus aureus: NEGATIVE

## 2022-10-24 LAB — CBC WITH DIFFERENTIAL/PLATELET
Abs Immature Granulocytes: 0.02 10*3/uL (ref 0.00–0.07)
Basophils Absolute: 0.1 10*3/uL (ref 0.0–0.1)
Basophils Relative: 1 %
Eosinophils Absolute: 0.5 10*3/uL (ref 0.0–0.5)
Eosinophils Relative: 4 %
HCT: 47.7 % (ref 39.0–52.0)
Hemoglobin: 16 g/dL (ref 13.0–17.0)
Immature Granulocytes: 0 %
Lymphocytes Relative: 15 %
Lymphs Abs: 1.6 10*3/uL (ref 0.7–4.0)
MCH: 29.2 pg (ref 26.0–34.0)
MCHC: 33.5 g/dL (ref 30.0–36.0)
MCV: 87 fL (ref 80.0–100.0)
Monocytes Absolute: 1.1 10*3/uL — ABNORMAL HIGH (ref 0.1–1.0)
Monocytes Relative: 10 %
Neutro Abs: 7.6 10*3/uL (ref 1.7–7.7)
Neutrophils Relative %: 70 %
Platelets: 229 10*3/uL (ref 150–400)
RBC: 5.48 MIL/uL (ref 4.22–5.81)
RDW: 13.2 % (ref 11.5–15.5)
WBC: 11 10*3/uL — ABNORMAL HIGH (ref 4.0–10.5)
nRBC: 0 % (ref 0.0–0.2)

## 2022-10-24 LAB — HEMOGLOBIN A1C
Hgb A1c MFr Bld: 5.4 % (ref 4.8–5.6)
Mean Plasma Glucose: 108.28 mg/dL

## 2022-10-24 LAB — GLUCOSE, CAPILLARY: Glucose-Capillary: 122 mg/dL — ABNORMAL HIGH (ref 70–99)

## 2022-10-24 LAB — COMPREHENSIVE METABOLIC PANEL
ALT: 26 U/L (ref 0–44)
AST: 20 U/L (ref 15–41)
Albumin: 4 g/dL (ref 3.5–5.0)
Alkaline Phosphatase: 51 U/L (ref 38–126)
Anion gap: 8 (ref 5–15)
BUN: 18 mg/dL (ref 8–23)
CO2: 22 mmol/L (ref 22–32)
Calcium: 8.6 mg/dL — ABNORMAL LOW (ref 8.9–10.3)
Chloride: 106 mmol/L (ref 98–111)
Creatinine, Ser: 0.99 mg/dL (ref 0.61–1.24)
GFR, Estimated: >60 mL/min (ref >60)
Glucose, Bld: 107 mg/dL — ABNORMAL HIGH (ref 70–99)
Potassium: 4.2 mmol/L (ref 3.5–5.1)
Sodium: 136 mmol/L (ref 135–145)
Total Bilirubin: 0.9 mg/dL (ref 0.3–1.2)
Total Protein: 6.9 g/dL (ref 6.5–8.1)

## 2022-10-24 LAB — NO BLOOD PRODUCTS

## 2022-10-28 NOTE — Progress Notes (Signed)
Case: [id number] Date/Time: 11/05/22 1012   Procedure: TOTAL HIP ARTHROPLASTY (Right: Hip)   Anesthesia type: Spinal   Pre-op diagnosis: OA RIGHT HIP   Location: [facility] ROOM 06 / [facility]   Surgeons: Joen Laura, MD       DISCUSSION: Richard Gallagher is a 67 year old male who presents to PAT prior to surgery listed above.  Past medical history significant for hypertension, aortic atherosclerosis, thoracic aortic aneurysm, dilated aortic root, chronic DOE, sleep apnea (uses CPAP), right bundle branch block, morbid obesity.  The patient's PMH in his chart lists that he has had a MI and CAD however per chart review it appears he does not have these diagnoses. There is also a diagnosis of COPD which is questionable. He follows with Pulmonology for chronic DOE. Has tried inhalers in the past which have not helped. Pulmonology suspects he has "upper airway problems" with post-nasal drip and GERD or possible asthma.   Patient follows with cardiology at [facility] for hypertension and was referred to cardiothoracic surgery due to his thoracic aortic aneurysm and dilated aortic root.  Per CT surgery on 03/18/22:  "He had a recent CT in September that I reviewed showing a 5.2cm aortic root aneurysm, which is stable from previous imaging. Echo today demonstrates normal LV function and mild AI. The recommendation for surgical intervention for aortic root aneurysm is >5.5cm. Will plan for repeat CT and echo in one year."   VS: BP 139/79   Pulse (!) 48   Resp 18   Ht 5\' 9"  (1.753 m)   Wt 112 kg   SpO2 95%   BMI 36.48 kg/m   PROVIDERS: Lonie Peak, PA-C Cardiology: Marcina Millard, MD Cardiothoracic surgery: Nance Pew, MD  LABS: Labs reviewed: Acceptable for surgery. (all labs ordered are listed, but only abnormal results are displayed)  Labs Reviewed  CBC WITH DIFFERENTIAL/PLATELET - Abnormal; Notable for the following components:      Result Value   WBC 11.0 (*)    Monocytes Absolute  1.1 (*)    All other components within normal limits  COMPREHENSIVE METABOLIC PANEL - Abnormal; Notable for the following components:   Glucose, Bld 107 (*)    Calcium 8.6 (*)    All other components within normal limits  GLUCOSE, CAPILLARY - Abnormal; Notable for the following components:   Glucose-Capillary 122 (*)    All other components within normal limits  SURGICAL PCR SCREEN  HEMOGLOBIN A1C  NO BLOOD PRODUCTS     IMAGES:  CTA chest 02/13/22:  IMPRESSION: 1. Dilatation of the aortic root measuring up to 5.3 cm diameter and ascending thoracic aorta measuring up to 4.1 cm, stable compared to 04/07/2019. Recommend semi-annual imaging followup by CTA or MRA and referral to cardiothoracic surgery if not already obtained. This recommendation follows 2010 ACCF/AHA/AATS/ACR/ASA/SCA/SCAI/SIR/STS/SVM Guidelines for the Diagnosis and Management of Patients With Thoracic Aortic Disease. Circulation. 2010; 121: A540-J81. Aortic aneurysm NOS (ICD10-I71.9) 2. Benign pulmonary nodules. No additional dedicated follow-up imaging is indicated. 3. Diffuse decreased attenuation of the liver, suggestive of hepatic steatosis. 4. Scattered diverticula in the visualized distal transverse and proximal descending colon without findings of diverticulitis.   EKG 10/24/22:  Sinus bradycardia, rate    CV:  Echo 03/18/22:  INTERPRETATION ---------------------------------------------------------------  NORMAL LEFT VENTRICULAR SYSTOLIC FUNCTION  NORMAL LA PRESSURES WITH NORMAL DIASTOLIC FUNCTION  NORMAL RIGHT VENTRICULAR SYSTOLIC FUNCTION  VALVULAR REGURGITATION: MILD AR, TRIVIAL MR, TRIVIAL PR, TRIVIAL TR  NO VALVULAR STENOSIS  DILATED AO SINUS (5.0CM)  DILATED  ASC. AORTA (4.4cm)  Past Medical History:  Diagnosis Date   Acute appendicitis    Aortic atherosclerosis (HCC)    Arthritis    Collagen vascular disease (HCC)    COPD (chronic obstructive pulmonary disease) (HCC)    Dilated  aortic root (HCC)    Dysrhythmia    right bundle branch block...(not new)   Essential hypertension 02/04/2017   Fatty liver    Headache    takes atenolol for this   Hyperglycemia    Moderate obesity    Had been better controlled while he was living in [name] He gained 30 pounds since returning in January 2018   Myocardial infarction [facility]) 2016   lower right hand side of heart...no damage done   Right bundle branch block (RBBB) on electrocardiogram (ECG) 05/2016   Ruptured appendicitis 06/20/2017   Sleep apnea    NO CPAP.  instructed years ago to use. lost weight   Thoracic aortic aneurysm [facility])    Thoracic aortic aneurysm without rupture (HCC) 05/2016   a. noted on Cardiac Cath with TAA Angio - Aortic Root 5.1 cm. b. echo 02/2017 - dilated aortic root (46mm) and ascending aorta (43mm).    Past Surgical History:  Procedure Laterality Date   BACK SURGERY  1995   LOWER   CARDIAC CATHETERIZATION  05/2016    [facility] [place], [name] Dr. Harrell Lark) 05/22/16: EF 55%. Normal coronary arteries. Dilated aortic root at 5.1 cm. 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 codominant   COLONOSCOPY WITH PROPOFOL N/A 08/04/2017   Procedure: COLONOSCOPY WITH PROPOFOL;  Surgeon: Toney Reil, MD;  Location: [facility] ENDOSCOPY;  Service: Gastroenterology;  Laterality: N/A;   COLONOSCOPY WITH PROPOFOL N/A 01/11/2018   Procedure: COLONOSCOPY WITH PROPOFOL;  Surgeon: Toney Reil, MD;  Location[facility] ENDOSCOPY;  Service: Gastroenterology;  Laterality: N/A;   COLONOSCOPY WITH PROPOFOL N/A 10/08/2021   Procedure: COLONOSCOPY WITH PROPOFOL;  Surgeon: Toney Reil, MD;  Location: [facility] ENDOSCOPY;  Service: Gastroenterology;  Laterality: N/A;   LAPAROSCOPIC APPENDECTOMY N/A 02/19/2018   Procedure: APPENDECTOMY LAPAROSCOPIC;  Surgeon: Lattie Haw, MD;  Location: [facility] ORS;  Service: General;  Laterality: N/A;   ROTATOR CUFF REPAIR Left 04/2017   TRANSTHORACIC  ECHOCARDIOGRAM  02/2018    Normal LV size with mild LVH -No RWMA.  GR 1 DD. Marland Kitchen  EF 55 to 60%.  Mild RV dilation with mild decrease function.  Moderately dilated aortic root (estimated 48 mm with ascending aorta estimated at 42 mm)   UMBILICAL HERNIA REPAIR N/A 02/19/2018   Procedure: HERNIA REPAIR UMBILICAL ADULT;  Surgeon: Lattie Haw, MD;  Location: [facility] ORS;  Service: General;  Laterality: N/A;    MEDICATIONS:  amLODipine (NORVASC) 5 MG tablet   aspirin EC 81 MG tablet   atenolol (TENORMIN) 50 MG tablet   docusate sodium (COLACE) 100 MG capsule   losartan (COZAAR) 50 MG tablet   meloxicam (MOBIC) 15 MG tablet   Multiple Vitamins-Minerals (MENS 50+ MULTIVITAMIN) TABS   Omega-3 Fatty Acids (FISH OIL) 1200 MG CAPS   zolpidem (AMBIEN) 10 MG tablet   No current facility-administered medications for this encounter.

## 2022-10-29 NOTE — Anesthesia Preprocedure Evaluation (Addendum)
Anesthesia Evaluation  Patient identified by MRN, date of birth, ID band Patient awake    Reviewed: Allergy & Precautions, H&P , NPO status , Patient's Chart, lab work & pertinent test results  Airway Mallampati: II  TM Distance: >3 FB Neck ROM: Full    Dental no notable dental hx. (+) Teeth Intact, Dental Advisory Given   Pulmonary neg pulmonary ROS, sleep apnea and Continuous Positive Airway Pressure Ventilation    Pulmonary exam normal breath sounds clear to auscultation       Cardiovascular Exercise Tolerance: Good hypertension, + Past MI  negative cardio ROS Normal cardiovascular exam+ dysrhythmias  Rhythm:Regular Rate:Normal     Neuro/Psych  Headaches negative neurological ROS  negative psych ROS   GI/Hepatic negative GI ROS, Neg liver ROS,,,  Endo/Other  negative endocrine ROS  Morbid obesity  Renal/GU negative Renal ROS  negative genitourinary   Musculoskeletal negative musculoskeletal ROS (+) Arthritis , Osteoarthritis,    Abdominal   Peds negative pediatric ROS (+)  Hematology negative hematology ROS (+)   Anesthesia Other Findings   Reproductive/Obstetrics negative OB ROS                             Anesthesia Physical Anesthesia Plan  ASA: 3  Anesthesia Plan: MAC and Spinal   Post-op Pain Management: Minimal or no pain anticipated, Tylenol PO (pre-op)* and Celebrex PO (pre-op)*   Induction: Intravenous  PONV Risk Score and Plan: 1 and Ondansetron, Dexamethasone, Treatment may vary due to age or medical condition and Propofol infusion  Airway Management Planned: Natural Airway, Simple Face Mask and Mask  Additional Equipment: None  Intra-op Plan:   Post-operative Plan:   Informed Consent: I have reviewed the patients History and Physical, chart, labs and discussed the procedure including the risks, benefits and alternatives for the proposed anesthesia with the  patient or authorized representative who has indicated his/her understanding and acceptance.       Plan Discussed with: Anesthesiologist and CRNA  Anesthesia Plan Comments: (See PAT note from 5/17 by Sherlie Ban PA-C  DISCUSSION: Richard Gallagher is a 67 year old male who presents to PAT prior to surgery listed above.  Past medical history significant for hypertension, aortic atherosclerosis, thoracic aortic aneurysm, dilated aortic root, chronic DOE, sleep apnea (uses CPAP), right bundle branch block, morbid obesity.   The patient's PMH in his chart lists that he has had a MI and CAD however per chart review it appears he does not have these diagnoses. There is also a diagnosis of COPD which is questionable. He follows with Pulmonology for chronic DOE. Has tried inhalers in the past which have not helped. Pulmonology suspects he has "upper airway problems" with post-nasal drip and GERD or possible asthma.    Patient follows with cardiology at [facility] for hypertension and was referred to cardiothoracic surgery due to his thoracic aortic aneurysm and dilated aortic root.  Per CT surgery on 03/18/22:   "He had a recent CT in September that I reviewed showing a 5.2cm aortic root aneurysm, which is stable from previous imaging. Echo today demonstrates normal LV function and mild AI. The recommendation for surgical intervention for aortic root aneurysm is >5.5cm. Will plan for repeat CT and echo in one year."    Of note, patient's pulse was 48 at PAT visit. Called patient on 5/22 and he denies any lightheadedness/dizziness. Does endorse quite a bit of fatigue which is chronic. He states his pulse is  usually in the 50s. Per chart review it appears pulse is usually 60s. His EKG appeared similar to prior but slower. He was advised to keep a log of his pulse for the next couple of days and if it is consistently in the 40s to call his Cardiologist's office and see if he needs his dose of atenolol adjusted. Pt  verbalized understanding.  CTA chest 02/13/22:  IMPRESSION: 1. Dilatation of the aortic root measuring up to 5.3 cm diameter and ascending thoracic aorta measuring up to 4.1 cm, stable compared to 04/07/2019. Recommend semi-annual imaging followup by CTA or MRA and referral to cardiothoracic surgery if not already obtained. This recommendation follows 2010 ACCF/AHA/AATS/ACR/ASA/SCA/SCAI/SIR/STS/SVM Guidelines for the Diagnosis and Management of Patients With Thoracic Aortic Disease. Circulation. 2010; 121: Z308-M57. Aortic aneurysm NOS (ICD10-I71.9) 2. Benign pulmonary nodules. No additional dedicated follow-up imaging is indicated. 3. Diffuse decreased attenuation of the liver, suggestive of hepatic steatosis. 4. Scattered diverticula in the visualized distal transverse and proximal descending colon without findings of diverticulitis. )        Anesthesia Quick Evaluation

## 2022-11-05 ENCOUNTER — Other Ambulatory Visit: Payer: Self-pay

## 2022-11-05 ENCOUNTER — Ambulatory Visit (HOSPITAL_COMMUNITY): Payer: Medicare Other

## 2022-11-05 ENCOUNTER — Ambulatory Visit (HOSPITAL_COMMUNITY)
Admission: RE | Admit: 2022-11-05 | Discharge: 2022-11-05 | Disposition: A | Discharge status: Home / Self Care | Payer: Medicare Other | Source: Ambulatory Visit | Attending: Orthopedic Surgery | Admitting: Orthopedic Surgery

## 2022-11-05 ENCOUNTER — Encounter (HOSPITAL_COMMUNITY): Payer: Self-pay | Admitting: Orthopedic Surgery

## 2022-11-05 ENCOUNTER — Ambulatory Visit (HOSPITAL_BASED_OUTPATIENT_CLINIC_OR_DEPARTMENT_OTHER): Payer: Medicare Other | Admitting: Anesthesiology

## 2022-11-05 ENCOUNTER — Encounter (HOSPITAL_COMMUNITY)
Admission: RE | Disposition: A | Discharge status: Home / Self Care | Payer: Self-pay | Source: Ambulatory Visit | Attending: Orthopedic Surgery

## 2022-11-05 ENCOUNTER — Ambulatory Visit (HOSPITAL_COMMUNITY): Payer: Medicare Other | Admitting: Medical

## 2022-11-05 DIAGNOSIS — I10 Essential (primary) hypertension: Secondary | ICD-10-CM | POA: Insufficient documentation

## 2022-11-05 DIAGNOSIS — G4733 Obstructive sleep apnea (adult) (pediatric): Secondary | ICD-10-CM | POA: Diagnosis not present

## 2022-11-05 DIAGNOSIS — G8929 Other chronic pain: Secondary | ICD-10-CM

## 2022-11-05 DIAGNOSIS — M1611 Unilateral primary osteoarthritis, right hip: Secondary | ICD-10-CM | POA: Insufficient documentation

## 2022-11-05 DIAGNOSIS — I252 Old myocardial infarction: Secondary | ICD-10-CM | POA: Diagnosis not present

## 2022-11-05 DIAGNOSIS — Z96641 Presence of right artificial hip joint: Secondary | ICD-10-CM | POA: Diagnosis not present

## 2022-11-05 DIAGNOSIS — Z9989 Dependence on other enabling machines and devices: Secondary | ICD-10-CM | POA: Diagnosis not present

## 2022-11-05 DIAGNOSIS — Z6836 Body mass index (BMI) 36.0-36.9, adult: Secondary | ICD-10-CM | POA: Insufficient documentation

## 2022-11-05 DIAGNOSIS — Z471 Aftercare following joint replacement surgery: Secondary | ICD-10-CM | POA: Diagnosis not present

## 2022-11-05 HISTORY — PX: TOTAL HIP ARTHROPLASTY: SHX124

## 2022-11-05 SURGERY — ARTHROPLASTY, HIP, TOTAL,POSTERIOR APPROACH
Anesthesia: Monitor Anesthesia Care | Site: Hip | Laterality: Right

## 2022-11-05 MED ORDER — ONDANSETRON HCL 4 MG/2ML IJ SOLN: 4.0000 mg | Freq: Once | INTRAMUSCULAR | Status: DC | PRN

## 2022-11-05 MED ORDER — METHOCARBAMOL 500 MG PO TABS: 500.0000 mg | ORAL_TABLET | Freq: Three times a day (TID) | ORAL | 0 refills | Status: AC | PRN

## 2022-11-05 MED ORDER — ACETAMINOPHEN 500 MG PO TABS
1000.0000 mg | ORAL_TABLET | Freq: Once | ORAL | Status: AC
  Administered 2022-11-05: 1000 mg via ORAL
  Filled 2022-11-05: qty 2

## 2022-11-05 MED ORDER — PHENYLEPHRINE HCL-NACL 20-0.9 MG/250ML-% IV SOLN
INTRAVENOUS | Status: DC | PRN
  Administered 2022-11-05: 50 ug/min via INTRAVENOUS

## 2022-11-05 MED ORDER — KETOROLAC TROMETHAMINE 15 MG/ML IJ SOLN
INTRAMUSCULAR | Status: AC
  Filled 2022-11-05: qty 1

## 2022-11-05 MED ORDER — BUPIVACAINE IN DEXTROSE 0.75-8.25 % IT SOLN
INTRATHECAL | Status: DC | PRN
  Administered 2022-11-05: 2 mL via INTRATHECAL

## 2022-11-05 MED ORDER — PHENYLEPHRINE 80 MCG/ML (10ML) SYRINGE FOR IV PUSH (FOR BLOOD PRESSURE SUPPORT)
PREFILLED_SYRINGE | INTRAVENOUS | Status: DC | PRN
  Administered 2022-11-05: 240 ug via INTRAVENOUS
  Administered 2022-11-05: 160 ug via INTRAVENOUS

## 2022-11-05 MED ORDER — METHOCARBAMOL 500 MG IVPB - SIMPLE MED
500.0000 mg | Freq: Four times a day (QID) | INTRAVENOUS | Status: DC | PRN
  Administered 2022-11-05: 500 mg via INTRAVENOUS

## 2022-11-05 MED ORDER — ISOPROPYL ALCOHOL 70 % SOLN
Status: DC | PRN
  Administered 2022-11-05: 1 via TOPICAL

## 2022-11-05 MED ORDER — FENTANYL CITRATE (PF) 100 MCG/2ML IJ SOLN
INTRAMUSCULAR | Status: AC
  Filled 2022-11-05: qty 2

## 2022-11-05 MED ORDER — METHOCARBAMOL 500 MG IVPB - SIMPLE MED
INTRAVENOUS | Status: AC
  Filled 2022-11-05: qty 55

## 2022-11-05 MED ORDER — SODIUM CHLORIDE 0.9 % IV SOLN: INTRAVENOUS | Status: DC

## 2022-11-05 MED ORDER — ONDANSETRON HCL 4 MG PO TABS: 4.0000 mg | ORAL_TABLET | Freq: Three times a day (TID) | ORAL | 0 refills | Status: AC | PRN

## 2022-11-05 MED ORDER — KETOROLAC TROMETHAMINE 15 MG/ML IJ SOLN
7.5000 mg | Freq: Four times a day (QID) | INTRAMUSCULAR | Status: DC
  Administered 2022-11-05: 7.5 mg via INTRAVENOUS

## 2022-11-05 MED ORDER — ONDANSETRON HCL 4 MG/2ML IJ SOLN
INTRAMUSCULAR | Status: AC
  Filled 2022-11-05: qty 2

## 2022-11-05 MED ORDER — POLYETHYLENE GLYCOL 3350 17 G PO PACK: 17.0000 g | PACK | Freq: Every day | ORAL | 0 refills | Status: AC

## 2022-11-05 MED ORDER — MIDAZOLAM HCL 5 MG/5ML IJ SOLN
INTRAMUSCULAR | Status: DC | PRN
  Administered 2022-11-05: 2 mg via INTRAVENOUS

## 2022-11-05 MED ORDER — LACTATED RINGERS IV BOLUS: 500.0000 mL | Freq: Once | INTRAVENOUS | Status: DC

## 2022-11-05 MED ORDER — ASPIRIN 81 MG PO TBEC: 81.0000 mg | DELAYED_RELEASE_TABLET | Freq: Two times a day (BID) | ORAL | 0 refills | Status: AC

## 2022-11-05 MED ORDER — BUPIVACAINE LIPOSOME 1.3 % IJ SUSP
INTRAMUSCULAR | Status: DC | PRN
  Administered 2022-11-05: 10 mL

## 2022-11-05 MED ORDER — ACETAMINOPHEN 500 MG PO TABS
ORAL_TABLET | ORAL | Status: AC
  Filled 2022-11-05: qty 2

## 2022-11-05 MED ORDER — DEXAMETHASONE SODIUM PHOSPHATE 10 MG/ML IJ SOLN
INTRAMUSCULAR | Status: AC
  Filled 2022-11-05: qty 1

## 2022-11-05 MED ORDER — OMEPRAZOLE 40 MG PO CPDR: 40.0000 mg | DELAYED_RELEASE_CAPSULE | Freq: Every day | ORAL | 0 refills | Status: AC

## 2022-11-05 MED ORDER — FENTANYL CITRATE (PF) 100 MCG/2ML IJ SOLN
INTRAMUSCULAR | Status: DC | PRN
  Administered 2022-11-05: 100 ug via INTRAVENOUS

## 2022-11-05 MED ORDER — DEXAMETHASONE SODIUM PHOSPHATE 10 MG/ML IJ SOLN
8.0000 mg | Freq: Once | INTRAMUSCULAR | Status: AC
  Administered 2022-11-05: 8 mg via INTRAVENOUS

## 2022-11-05 MED ORDER — ONDANSETRON HCL 4 MG/2ML IJ SOLN: 4.0000 mg | Freq: Four times a day (QID) | INTRAMUSCULAR | Status: DC | PRN

## 2022-11-05 MED ORDER — 0.9 % SODIUM CHLORIDE (POUR BTL) OPTIME
TOPICAL | Status: DC | PRN
  Administered 2022-11-05: 1000 mL

## 2022-11-05 MED ORDER — CELECOXIB 100 MG PO CAPS: 100.0000 mg | ORAL_CAPSULE | Freq: Two times a day (BID) | ORAL | 0 refills | Status: AC

## 2022-11-05 MED ORDER — BUPIVACAINE HCL (PF) 0.25 % IJ SOLN
INTRAMUSCULAR | Status: AC
  Filled 2022-11-05: qty 30

## 2022-11-05 MED ORDER — MIDAZOLAM HCL 2 MG/2ML IJ SOLN
INTRAMUSCULAR | Status: AC
  Filled 2022-11-05: qty 2

## 2022-11-05 MED ORDER — SODIUM CHLORIDE (PF) 0.9 % IJ SOLN
INTRAMUSCULAR | Status: AC
  Filled 2022-11-05: qty 50

## 2022-11-05 MED ORDER — CHLORHEXIDINE GLUCONATE 0.12 % MT SOLN
15.0000 mL | Freq: Once | OROMUCOSAL | Status: AC
  Administered 2022-11-05: 15 mL via OROMUCOSAL

## 2022-11-05 MED ORDER — HYDROMORPHONE HCL 1 MG/ML IJ SOLN
INTRAMUSCULAR | Status: AC
  Filled 2022-11-05: qty 1

## 2022-11-05 MED ORDER — OXYCODONE HCL 5 MG PO TABS: 5.0000 mg | ORAL_TABLET | ORAL | Status: DC | PRN

## 2022-11-05 MED ORDER — PHENYLEPHRINE HCL (PRESSORS) 10 MG/ML IV SOLN
INTRAVENOUS | Status: AC
  Filled 2022-11-05: qty 1

## 2022-11-05 MED ORDER — BUPIVACAINE-EPINEPHRINE (PF) 0.25% -1:200000 IJ SOLN
INTRAMUSCULAR | Status: DC | PRN
  Administered 2022-11-05: 30 mL

## 2022-11-05 MED ORDER — EPHEDRINE SULFATE-NACL 50-0.9 MG/10ML-% IV SOSY
PREFILLED_SYRINGE | INTRAVENOUS | Status: DC | PRN
  Administered 2022-11-05: 10 mg via INTRAVENOUS

## 2022-11-05 MED ORDER — ONDANSETRON HCL 4 MG PO TABS: 4.0000 mg | ORAL_TABLET | Freq: Four times a day (QID) | ORAL | Status: DC | PRN

## 2022-11-05 MED ORDER — SODIUM CHLORIDE 0.9 % IR SOLN
Status: DC | PRN
  Administered 2022-11-05: 1000 mL

## 2022-11-05 MED ORDER — ACETAMINOPHEN 500 MG PO TABS: 1000.0000 mg | ORAL_TABLET | Freq: Three times a day (TID) | ORAL | 0 refills | Status: AC | PRN

## 2022-11-05 MED ORDER — EPINEPHRINE PF 1 MG/ML IJ SOLN
INTRAMUSCULAR | Status: AC
  Filled 2022-11-05: qty 1

## 2022-11-05 MED ORDER — CEFAZOLIN SODIUM-DEXTROSE 2-4 GM/100ML-% IV SOLN
2.0000 g | INTRAVENOUS | Status: AC
  Administered 2022-11-05: 2 g via INTRAVENOUS
  Filled 2022-11-05: qty 100

## 2022-11-05 MED ORDER — ACETAMINOPHEN 160 MG/5ML PO SOLN: 325.0000 mg | ORAL | Status: DC | PRN

## 2022-11-05 MED ORDER — PROPOFOL 1000 MG/100ML IV EMUL
INTRAVENOUS | Status: AC
  Filled 2022-11-05: qty 100

## 2022-11-05 MED ORDER — FENTANYL CITRATE PF 50 MCG/ML IJ SOSY: 25.0000 ug | PREFILLED_SYRINGE | INTRAMUSCULAR | Status: DC | PRN

## 2022-11-05 MED ORDER — MEPERIDINE HCL 50 MG/ML IJ SOLN: 6.2500 mg | INTRAMUSCULAR | Status: DC | PRN

## 2022-11-05 MED ORDER — METHOCARBAMOL 500 MG PO TABS: 500.0000 mg | ORAL_TABLET | Freq: Four times a day (QID) | ORAL | Status: DC | PRN

## 2022-11-05 MED ORDER — OXYCODONE HCL 5 MG PO TABS: 5.0000 mg | ORAL_TABLET | ORAL | 0 refills | Status: AC | PRN

## 2022-11-05 MED ORDER — BUPIVACAINE LIPOSOME 1.3 % IJ SUSP: 10.0000 mL | Freq: Once | INTRAMUSCULAR | Status: DC

## 2022-11-05 MED ORDER — HYDROMORPHONE HCL 1 MG/ML IJ SOLN
0.5000 mg | INTRAMUSCULAR | Status: DC | PRN
  Administered 2022-11-05: 1 mg via INTRAVENOUS

## 2022-11-05 MED ORDER — CEFAZOLIN SODIUM-DEXTROSE 2-4 GM/100ML-% IV SOLN: 2.0000 g | Freq: Four times a day (QID) | INTRAVENOUS | Status: DC

## 2022-11-05 MED ORDER — ACETAMINOPHEN 500 MG PO TABS
1000.0000 mg | ORAL_TABLET | Freq: Four times a day (QID) | ORAL | Status: DC
  Administered 2022-11-05: 1000 mg via ORAL

## 2022-11-05 MED ORDER — POVIDONE-IODINE 10 % EX SWAB: 2.0000 | Freq: Once | CUTANEOUS | Status: DC

## 2022-11-05 MED ORDER — SODIUM CHLORIDE (PF) 0.9 % IJ SOLN
INTRAMUSCULAR | Status: DC | PRN
  Administered 2022-11-05: 30 mL

## 2022-11-05 MED ORDER — PROPOFOL 10 MG/ML IV BOLUS
INTRAVENOUS | Status: DC | PRN
  Administered 2022-11-05: 20 mg via INTRAVENOUS

## 2022-11-05 MED ORDER — OXYCODONE HCL 5 MG/5ML PO SOLN: 5.0000 mg | Freq: Once | ORAL | Status: AC | PRN

## 2022-11-05 MED ORDER — ACETAMINOPHEN 325 MG PO TABS: 325.0000 mg | ORAL_TABLET | Freq: Four times a day (QID) | ORAL | Status: DC | PRN

## 2022-11-05 MED ORDER — OXYCODONE HCL 5 MG PO TABS
ORAL_TABLET | ORAL | Status: AC
  Filled 2022-11-05: qty 1

## 2022-11-05 MED ORDER — LACTATED RINGERS IV BOLUS: 250.0000 mL | Freq: Once | INTRAVENOUS | Status: DC

## 2022-11-05 MED ORDER — PROPOFOL 10 MG/ML IV BOLUS
INTRAVENOUS | Status: AC
  Filled 2022-11-05: qty 20

## 2022-11-05 MED ORDER — OXYCODONE HCL 5 MG PO TABS
5.0000 mg | ORAL_TABLET | Freq: Once | ORAL | Status: AC | PRN
  Administered 2022-11-05: 5 mg via ORAL

## 2022-11-05 MED ORDER — PROPOFOL 500 MG/50ML IV EMUL
INTRAVENOUS | Status: DC | PRN
  Administered 2022-11-05: 100 ug/kg/min via INTRAVENOUS

## 2022-11-05 MED ORDER — ORAL CARE MOUTH RINSE: 15.0000 mL | Freq: Once | OROMUCOSAL | Status: AC

## 2022-11-05 MED ORDER — SODIUM CHLORIDE (PF) 0.9 % IJ SOLN
INTRAMUSCULAR | Status: AC
  Filled 2022-11-05: qty 10

## 2022-11-05 MED ORDER — TRANEXAMIC ACID-NACL 1000-0.7 MG/100ML-% IV SOLN
1000.0000 mg | INTRAVENOUS | Status: AC
  Administered 2022-11-05: 1000 mg via INTRAVENOUS
  Filled 2022-11-05: qty 100

## 2022-11-05 MED ORDER — ACETAMINOPHEN 325 MG PO TABS: 325.0000 mg | ORAL_TABLET | ORAL | Status: DC | PRN

## 2022-11-05 MED ORDER — LACTATED RINGERS IV SOLN: INTRAVENOUS | Status: DC

## 2022-11-05 MED ORDER — BUPIVACAINE LIPOSOME 1.3 % IJ SUSP
INTRAMUSCULAR | Status: AC
  Filled 2022-11-05: qty 20

## 2022-11-05 MED ORDER — EPHEDRINE 5 MG/ML INJ
INTRAVENOUS | Status: AC
  Filled 2022-11-05: qty 5

## 2022-11-05 SURGICAL SUPPLY — 72 items
ADH SKN CLS APL DERMABOND .7 (GAUZE/BANDAGES/DRESSINGS) ×1
APL PRP STRL LF DISP 70% ISPRP (MISCELLANEOUS) ×2
BAG COUNTER SPONGE SURGICOUNT (BAG) IMPLANT
BAG DECANTER FOR FLEXI CONT (MISCELLANEOUS) ×2 IMPLANT
BAG SPEC THK2 15X12 ZIP CLS (MISCELLANEOUS) ×1
BAG SPNG CNTER NS LX DISP (BAG) ×1
BAG ZIPLOCK 12X15 (MISCELLANEOUS) ×2 IMPLANT
BLADE SAW SAG 25X90X1.19 (BLADE) ×2 IMPLANT
CHLORAPREP W/TINT 26 (MISCELLANEOUS) ×4 IMPLANT
COVER SURGICAL LIGHT HANDLE (MISCELLANEOUS) ×2 IMPLANT
DERMABOND ADVANCED .7 DNX12 (GAUZE/BANDAGES/DRESSINGS) ×2 IMPLANT
DRAPE HIP W/POCKET STRL (MISCELLANEOUS) ×2 IMPLANT
DRAPE INCISE IOBAN 66X45 STRL (DRAPES) ×2 IMPLANT
DRAPE INCISE IOBAN 85X60 (DRAPES) ×2 IMPLANT
DRAPE POUCH INSTRU U-SHP 10X18 (DRAPES) ×2 IMPLANT
DRAPE SHEET LG 3/4 BI-LAMINATE (DRAPES) ×6 IMPLANT
DRAPE SURG 17X11 SM STRL (DRAPES) ×2 IMPLANT
DRAPE U-SHAPE 47X51 STRL (DRAPES) ×4 IMPLANT
DRESSING AQUACEL AG SP 3.5X10 (GAUZE/BANDAGES/DRESSINGS) ×2 IMPLANT
DRSG AQUACEL AG ADV 3.5X10 (GAUZE/BANDAGES/DRESSINGS) IMPLANT
DRSG AQUACEL AG SP 3.5X10 (GAUZE/BANDAGES/DRESSINGS) ×1
ELECT BLADE TIP CTD 4 INCH (ELECTRODE) ×2 IMPLANT
ELECT REM PT RETURN 15FT ADLT (MISCELLANEOUS) ×2 IMPLANT
GLOVE BIO SURGEON STRL SZ 6.5 (GLOVE) ×4 IMPLANT
GLOVE BIOGEL PI IND STRL 6.5 (GLOVE) ×2 IMPLANT
GLOVE BIOGEL PI IND STRL 8 (GLOVE) ×2 IMPLANT
GLOVE SURG ORTHO 8.0 STRL STRW (GLOVE) ×4 IMPLANT
GOWN STRL REUS W/ TWL XL LVL3 (GOWN DISPOSABLE) ×4 IMPLANT
GOWN STRL REUS W/TWL XL LVL3 (GOWN DISPOSABLE) ×2
HANDPIECE INTERPULSE COAX TIP (DISPOSABLE)
HEAD CERAMIC FEMORAL 36MM (Head) IMPLANT
HOLDER FOLEY CATH W/STRAP (MISCELLANEOUS) ×2 IMPLANT
HOOD PEEL AWAY T7 (MISCELLANEOUS) ×6 IMPLANT
INSERT TRIDENT POLY 36 0DEG (Insert) IMPLANT
JET LAVAGE IRRISEPT WOUND (IRRIGATION / IRRIGATOR)
KIT BASIN OR (CUSTOM PROCEDURE TRAY) ×2 IMPLANT
KIT TURNOVER KIT A (KITS) IMPLANT
LAVAGE JET IRRISEPT WOUND (IRRIGATION / IRRIGATOR) IMPLANT
MANIFOLD NEPTUNE II (INSTRUMENTS) ×2 IMPLANT
MARKER SKIN DUAL TIP RULER LAB (MISCELLANEOUS) ×2 IMPLANT
NDL HYPO 22X1.5 SAFETY MO (MISCELLANEOUS) IMPLANT
NEEDLE HYPO 22X1.5 SAFETY MO (MISCELLANEOUS) IMPLANT
NS IRRIG 1000ML POUR BTL (IV SOLUTION) ×2 IMPLANT
PACK TOTAL JOINT (CUSTOM PROCEDURE TRAY) ×2 IMPLANT
PRESSURIZER FEMORAL UNIV (MISCELLANEOUS) IMPLANT
PROTECTOR NERVE ULNAR (MISCELLANEOUS) ×2 IMPLANT
RETRIEVER SUT HEWSON (MISCELLANEOUS) ×2 IMPLANT
SCREW HEX LP 6.5X20 (Screw) IMPLANT
SCREW HEX LP 6.5X35 (Screw) IMPLANT
SEALER BIPOLAR AQUA 6.0 (INSTRUMENTS) IMPLANT
SET HNDPC FAN SPRY TIP SCT (DISPOSABLE) IMPLANT
SHELL ACETABUL CLUSTER SZ 54 (Shell) IMPLANT
SPIKE FLUID TRANSFER (MISCELLANEOUS) ×6 IMPLANT
STEM HIP 4 127DEG (Stem) IMPLANT
SUCTION FRAZIER HANDLE 12FR (TUBING) ×1
SUCTION TUBE FRAZIER 12FR DISP (TUBING) ×2 IMPLANT
SUT BONE WAX W31G (SUTURE) ×2 IMPLANT
SUT ETHIBOND #5 BRAIDED 30INL (SUTURE) ×2 IMPLANT
SUT MNCRL AB 3-0 PS2 18 (SUTURE) ×2 IMPLANT
SUT STRATAFIX 0 PDS 27 VIOLET (SUTURE) ×1
SUT STRATAFIX PDO 1 14 VIOLET (SUTURE) ×1
SUT STRATFX PDO 1 14 VIOLET (SUTURE) ×1
SUT VIC AB 1 CT1 36 (SUTURE) IMPLANT
SUT VIC AB 2-0 CT2 27 (SUTURE) ×4 IMPLANT
SUTURE STRATFX 0 PDS 27 VIOLET (SUTURE) ×2 IMPLANT
SUTURE STRATFX PDO 1 14 VIOLET (SUTURE) ×2 IMPLANT
SYR 20ML LL LF (SYRINGE) ×4 IMPLANT
TOWEL OR 17X26 10 PK STRL BLUE (TOWEL DISPOSABLE) ×2 IMPLANT
TRAY FOLEY MTR SLVR 16FR STAT (SET/KITS/TRAYS/PACK) ×2 IMPLANT
TUBE SUCTION HIGH CAP CLEAR NV (SUCTIONS) ×2 IMPLANT
UNDERPAD 30X36 HEAVY ABSORB (UNDERPADS AND DIAPERS) ×2 IMPLANT
WATER STERILE IRR 1000ML POUR (IV SOLUTION) ×4 IMPLANT

## 2022-11-05 NOTE — Discharge Instructions (Signed)

## 2022-11-05 NOTE — Op Note (Signed)
11/05/2022  12:39 PM  PATIENT:  Richard Gallagher   MRN: [id number]  PRE-OPERATIVE DIAGNOSIS: End-stage right hip osteoarthritis  POST-OPERATIVE DIAGNOSIS:  same  PROCEDURE:  Procedure(s): Right TOTAL HIP ARTHROPLASTY  PREOPERATIVE INDICATIONS:    Richard Gallagher is an 67 y.o. male who has a diagnosis of end-stage right hip osteoarthritis and elected for surgical management after failing conservative treatment.  The risks benefits and alternatives were discussed with the patient including but not limited to the risks of nonoperative treatment, versus surgical intervention including infection, bleeding, nerve injury, periprosthetic fracture, the need for revision surgery, dislocation, leg length discrepancy, blood clots, cardiopulmonary complications, morbidity, mortality, among others, and they were willing to proceed.     OPERATIVE REPORT     SURGEON:  Weber Cooks, MD    ASSISTANT: Kathie Dike, PA-C, (Present throughout the entire procedure,  necessary for completion of procedure in a timely manner, assisting with retraction, instrumentation, and closure)     ANESTHESIA: Spinal  ESTIMATED BLOOD LOSS: 301cc    COMPLICATIONS:  None.     COMPONENTS:   Stryker Trident 254 mm acetabular shell, 6.5 hex screws x 2, neutral Trident X.3 polyethylene liner, Stryker Accolade 2 with 120 severe neck angle size 4 femoral stem, 36+0 ceramic head Implant Name Type Inv. Item Serial No. Manufacturer Lot No. LRB No. Used Action  SHELL ACETABUL CLUSTER SZ 54 - [id number] Shell SHELL ACETABUL CLUSTER SZ 54  [employer] [id number] Right 1 Implanted  SCREW HEX LP 6.5X35 - [id number] Screw SCREW HEX LP 6.5X35  [employer] [id number] Right 1 Implanted  SCREW HEX LP 6.5X20 - [id number] Screw SCREW HEX LP 6.5X20  [employer] [id number] Right 1 Implanted  INSERT TRIDENT POLY 36 0DEG - [id number] Insert INSERT TRIDENT POLY 36 0DEG  [employer] [id number] Right 1 Implanted  STEM  HIP 4 127DEG - [id number] Stem STEM HIP 4 127DEG  [employer] [id number] Right 1 Implanted  HEAD CERAMIC FEMORAL - [id number] Head HEAD CERAMIC FEMORAL  [employer] [id number] Right 1 Implanted    The aquamantis was not used     PROCEDURE IN DETAIL:   The patient was met in the holding area and  identified.  The appropriate hip was identified and marked at the operative site.  The patient was then transported to the OR  and  placed under anesthesia.  At that point, the patient was  placed in the lateral decubitus position with the operative side up and  secured to the operating room table  and all bony prominences padded. A subaxillary role was also placed.    The operative lower extremity was prepped from the iliac crest to the distal leg.  Sterile draping was performed.  Preoperative antibiotics, 2 gm of ancef,1 gm of Tranexamic Acid, and 8 mg of Decadron administered. Time out was performed prior to incision.      A routine posterolateral approach was utilized via sharp dissection  carried down to the subcutaneous tissue.  Gross bleeders were Bovie coagulated.  The iliotibial band was identified and incised along the length of the skin incision through the glute max fascia.  Charnley retractor was placed with care to protect the sciatic nerve posteriorly.  With the hip internally rotated, the piriformis tendon was identified and released from the femoral insertion and tagged with a #5 Ethibond.  A capsulotomy was then performed off the femoral insertion and also tagged with a #5 Ethibond.    The femoral neck was exposed, and  I resected the femoral neck based on preoperative templating relative to the lesser trochanter.    I then exposed the deep acetabulum, cleared out any tissue including the ligamentum teres.  After adequate visualization, I excised the labrum.  I then started reaming with a 50 mm reamer, first medializing to the floor of the cotyloid fossa, and then in  the position of the cup aiming towards the greater sciatic notch, matching the version of the transverse acetabular ligament and tucked under the anterior wall. I reamed up to 54 mm reamer with good bony bed preparation and a 54 mm cup was chosen.  The real cup was then impacted into place.  Appropriate version and inclination was confirmed clinically matching their bony anatomy, and also with the use of the jig.  I placed 2 screws in the posterior superior quadrant to augment fixation.  A neutral liner was placed and impacted. It was confirmed to be appropriately seated and the acetabular retractors were removed.    I then prepared the proximal femur using the box cutter, Charnley awl, and then sequentially broached starting with 0 up to a size 3.  At this point I felt the stem was getting potted distally given the door a femur bone.  Used flexible reamers and reamed up sequentially up to 11 mm.  Then return to broaching was able to appropriately seated a size 4 broach which had had excellent medial to lateral fill  A trial broach, neck, and head was utilized, and I reduced the hip and it was found to have excellent stability.  There was no impingement with full extension and 90 degrees external rotation.  The hip was stable at the position of sleep and with 90 degrees flexion and 70degrees of internal rotation.  Leg lengths were also clinically assessed in the lateral position and felt to be equal. Intra-Op flatplate was obtained and confirmed appropriate component positions.  Good fill of the femur with the size 4 broach.  And restoration of leg length and offset. No evidence or concern for fracture.  A final femoral prosthesis size 4 was selected. I then impacted the real femoral prosthesis into place.I again trialed and selected a 36+ 0mm ball. The hip was then reduced and taken through a range of motion. There was no impingement with full extension and 90 degrees external rotation.  The hip was stable  at the position of sleep and with 90 degrees flexion and 70 degrees of internal rotation. Leg lengths were  again assessed and felt to be restored.  We then opened, and I impacted the real head ball into place.  The posterior capsule was then closed with #5 Ethibond.  The piriformis was repaired through the base of the abductor tendon using a [place] suture passer.  I then irrigated the hip copiously with dilute Betadine and with normal saline pulse lavage. Periarticular injection was then performed with Exparel.   We repaired the fascia #1 barbed suture, followed by 0 barbed suture for the subcutaneous fat.  Skin was closed with 2-0 Vicryl and 3-0 Monocryl.  Dermabond and Aquacel dressing were applied. The patient was then awakened and returned to PACU in stable and satisfactory condition.  Leg lengths in the supine position were assessed and felt to be clinically equal. There were no complications.  Post op recs: WB: WBAT RLE, No formal hip precautions Abx: ancef Imaging: PACU pelvis Xray Dressing: Aquacell, keep intact until follow up DVT prophylaxis: Aspirin 81BID starting POD1 Follow up: 2  weeks after surgery for a wound check with Dr. Blanchie Dessert at [facility].  Address: [address], [place], [place] [address]  Office Phone: [phone]   Weber Cooks, MD Orthopedic Surgeon

## 2022-11-05 NOTE — Interval H&P Note (Signed)
The patient has been re-examined, and the chart reviewed, and there have been no interval changes to the documented history and physical.    Plan for R THA for R hip OA  The operative side was examined and the patient was confirmed to have sensation to DPN, SPN, TN intact, Motor EHL, ext, flex 5/5, and DP 2+, PT 2+, No significant edema.   The risks, benefits, and alternatives have been discussed at length with patient, and the patient is willing to proceed.  Right hip marked. Consent has been signed.  

## 2022-11-05 NOTE — Anesthesia Procedure Notes (Signed)
Spinal  Patient location during procedure: OR Start time: 11/05/2022 1:00 PM End time: 11/05/2022 10:55 AM Reason for block: surgical anesthesia Staffing Performed: resident/CRNA  Anesthesiologist: Bethena Midget, MD Resident/CRNA: Doran Clay, CRNA Performed by: Doran Clay, CRNA Authorized by: Bethena Midget, MD   Preanesthetic Checklist Completed: patient identified, IV checked, site marked, risks and benefits discussed, surgical consent, monitors and equipment checked, pre-op evaluation and timeout performed Spinal Block Patient position: sitting Prep: DuraPrep Patient monitoring: heart rate, cardiac monitor, continuous pulse ox and blood pressure Approach: midline Location: L3-4 Needle Needle type: Pencan  Needle gauge: 24 G Needle length: 10 cm Needle insertion depth: 7 cm Assessment Sensory level: T6 Events: CSF return Additional Notes Timeout performed. Patient in sitting position. L3-4 identified. Cleansed with Duraprep. SAB without difficulty. To R lateral position x 2 minutes.

## 2022-11-05 NOTE — Anesthesia Postprocedure Evaluation (Signed)
Anesthesia Post Note  Patient: Richard Gallagher  Procedure(s) Performed: TOTAL HIP ARTHROPLASTY (Right: Hip)     Patient location during evaluation: PACU Anesthesia Type: MAC and Spinal Level of consciousness: oriented and awake and alert Pain management: pain level controlled Vital Signs Assessment: post-procedure vital signs reviewed and stable Respiratory status: spontaneous breathing, respiratory function stable and patient connected to nasal cannula oxygen Cardiovascular status: blood pressure returned to baseline and stable Postop Assessment: no headache, no backache and no apparent nausea or vomiting Anesthetic complications: no   No notable events documented.  Last Vitals:  Vitals:   11/05/22 1400 11/05/22 1415  BP: (!) 104/59 120/76  Pulse: 61 60  Resp: 15 (!) 8  Temp:    SpO2: 90% 91%    Last Pain:  Vitals:   11/05/22 1415  TempSrc:   PainSc: 0-No pain                 Lyne Khurana

## 2022-11-05 NOTE — Transfer of Care (Signed)
Immediate Anesthesia Transfer of Care Note  Patient: Richard Gallagher  Procedure(s) Performed: TOTAL HIP ARTHROPLASTY (Right: Hip)  Patient Location: PACU  Anesthesia Type:Spinal  Level of Consciousness: sedated  Airway & Oxygen Therapy: Patient Spontanous Breathing and Patient connected to face mask oxygen  Post-op Assessment: Report given to RN and Post -op Vital signs reviewed and stable  Post vital signs: Reviewed and stable  Last Vitals:  Vitals Value Taken Time  BP 102/65 11/05/22 1315  Temp    Pulse 74 11/05/22 1317  Resp 19 11/05/22 1317  SpO2 93 % 11/05/22 1317  Vitals shown include unvalidated device data.  Last Pain:  Vitals:   11/05/22 0828  TempSrc:   PainSc: 7       Patients Stated Pain Goal: 5 (11/05/22 0828)  Complications: No notable events documented.

## 2022-11-05 NOTE — Evaluation (Signed)
Physical Therapy Evaluation Patient Details Name: Richard Gallagher MRN: [id number] DOB: 01-06-1956 Today's Date: 11/05/2022  History of Present Illness  67 yo male presents to therapy s/p R THA, posteriorlateral approach on 11/05/2022 due to failure of conservative measures. R LE WBAT and no formal hip precautions. Pt PMH includes but is not limited to: HDL, umbilical hernia, DOE, thoracic arotic aneurysm without rupture, HTN, COPD, RBBB, OSA, L RTC repair, and Lumbar sx.  Clinical Impression    Richard Gallagher is a 67 y.o. male POD 0 s/p R THA. Patient reports mod I with mobility at baseline. Patient is now limited by functional impairments (see PT problem list below) and requires min guard and cues for transfers and gait with RW. Patient was able to ambulate 80 feet with RW and min guard and progressing to S and cues for safe walker management. Patient educated on safe sequencing for stair navigation, safety and I with functional mobility tasks, fall risk prevention, pain management and use of ice/CP pt and spouse verbalized understanding safe guarding position for people assisting with mobility. Patient instructed in exercises to facilitate ROM and circulation reviewed with HO provided . Patient will benefit from continued skilled PT interventions to address impairments and progress towards PLOF. Patient has met mobility goals at adequate level for discharge home with family support and OPPT services starting 6/3; will continue to follow if pt continues acute stay to progress towards Mod I goals.      Recommendations for follow up therapy are one component of a multi-disciplinary discharge planning process, led by the attending physician.  Recommendations may be updated based on patient status, additional functional criteria and insurance authorization.  Follow Up Recommendations       Assistance Recommended at Discharge Intermittent Supervision/Assistance  Patient can return home with the  following  A little help with walking and/or transfers;A little help with bathing/dressing/bathroom;Assistance with cooking/housework;Assist for transportation;Help with stairs or ramp for entrance    Equipment Recommendations None recommended by PT (pt reports DME in home setting)  Recommendations for Other Services       Functional Status Assessment Patient has had a recent decline in their functional status and demonstrates the ability to make significant improvements in function in a reasonable and predictable amount of time.     Precautions / Restrictions Precautions Precautions: Fall Restrictions Weight Bearing Restrictions: No      Mobility  Bed Mobility Overal bed mobility: Needs Assistance Bed Mobility: Supine to Sit     Supine to sit: Min guard     General bed mobility comments: cues for safety    Transfers Overall transfer level: Needs assistance Equipment used: Rolling walker (2 wheels) Transfers: Sit to/from Stand Sit to Stand: Min guard           General transfer comment: cues for proper UE and RW placement    Ambulation/Gait Ambulation/Gait assistance: Min guard Gait Distance (Feet): 80 Feet Assistive device: Rolling walker (2 wheels) Gait Pattern/deviations: Step-to pattern, Antalgic, Wide base of support Gait velocity: decreased     General Gait Details: step almost through pattern, cues for safety and proper RW placement  Stairs Stairs: Yes Stairs assistance: Min guard Stair Management: Two rails Number of Stairs: 3 General stair comments: cues for sequencing  Wheelchair Mobility    Modified Rankin (Stroke Patients Only)       Balance Overall balance assessment: Needs assistance Sitting-balance support: Feet supported Sitting balance-Leahy Scale: Good     Standing balance support: Bilateral  upper extremity supported, During functional activity, Reliant on assistive device for balance Standing balance-Leahy Scale:  Fair Standing balance comment: static standing no UE support and no evidence of LOB                             Pertinent Vitals/Pain Pain Assessment Pain Assessment: 0-10 Pain Score: 3  Pain Location: R hip Pain Descriptors / Indicators: Aching, Constant, Discomfort, Operative site guarding Pain Intervention(s): Limited activity within patient's tolerance, Monitored during session, Premedicated before session, Repositioned, Ice applied    Home Living Family/patient expects to be discharged to:: Private residence Living Arrangements: Spouse/significant other Available Help at Discharge: Family Type of Home: House Home Access: Stairs to enter Entrance Stairs-Rails: Left;Right;Can reach both Secretary/administrator of Steps: 5   Home Layout: One level Home Equipment: Agricultural consultant (2 wheels);Crutches;Cane - single point;Toilet riser;Hand held shower head;Shower seat - built in      Prior Function Prior Level of Function : Independent/Modified Independent             Mobility Comments: Intermittent use of SPC pending pain, mod I with ADLs with the exception of donning socks, IADLs, driving       Hand Dominance        Extremity/Trunk Assessment        Lower Extremity Assessment Lower Extremity Assessment: RLE deficits/detail RLE Deficits / Details: ankle DF/PF 5/5 RLE Sensation: WNL    Cervical / Trunk Assessment Cervical / Trunk Assessment:  (wfl)  Communication   Communication: No difficulties  Cognition Arousal/Alertness: Awake/alert Behavior During Therapy: WFL for tasks assessed/performed Overall Cognitive Status: Within Functional Limits for tasks assessed                                          General Comments General comments (skin integrity, edema, etc.): pt and spouse ed on no formal hip precautions    Exercises Total Joint Exercises Ankle Circles/Pumps: AROM, Both, 20 reps Quad Sets: AROM, Right, 5 reps Heel  Slides: AROM, Right, 5 reps Hip ABduction/ADduction: AROM, Right, 5 reps, Supine, Standing Long Arc Quad: AROM, Right, 5 reps Knee Flexion: AROM, Right, 5 reps, Standing Standing Hip Extension: AROM, Right, 5 reps   Assessment/Plan    PT Assessment Patient needs continued PT services  PT Problem List Decreased strength;Decreased range of motion;Decreased activity tolerance;Decreased balance;Decreased mobility;Decreased coordination;Decreased cognition;Decreased knowledge of use of DME;Pain       PT Treatment Interventions DME instruction;Gait training;Stair training;Functional mobility training;Therapeutic exercise;Therapeutic activities;Balance training;Neuromuscular re-education;Patient/family education;Modalities    PT Goals (Current goals can be found in the Care Plan section)  Acute Rehab PT Goals Patient Stated Goal: to go back to work part time PT Goal Formulation: With patient Time For Goal Achievement: 11/19/22 Potential to Achieve Goals: Good    Frequency 7X/week     Co-evaluation               AM-PAC PT "6 Clicks" Mobility  Outcome Measure Help needed turning from your back to your side while in a flat bed without using bedrails?: None Help needed moving from lying on your back to sitting on the side of a flat bed without using bedrails?: A Little Help needed moving to and from a bed to a chair (including a wheelchair)?: A Little Help needed standing up from a chair using your arms (  e.g., wheelchair or bedside chair)?: A Little Help needed to walk in hospital room?: A Little Help needed climbing 3-5 steps with a railing? : A Little 6 Click Score: 19    End of Session Equipment Utilized During Treatment: Gait belt Activity Tolerance: Patient limited by fatigue Patient left: in chair;with call bell/phone within reach;with family/visitor present Nurse Communication: Mobility status;Other (comment) (pt readiness for d/c from PT standpoint) PT Visit Diagnosis:  Unsteadiness on feet (R26.81);Other abnormalities of gait and mobility (R26.89);Muscle weakness (generalized) (M62.81);Pain Pain - Right/Left: Right Pain - part of body: Hip    Time: 1610-9604 PT Time Calculation (min) (ACUTE ONLY): 41 min   Charges:   PT Evaluation $PT Eval Low Complexity: 1 Low PT Treatments $Gait Training: 8-22 mins $Therapeutic Exercise: 8-22 mins         [place], PT Acute Rehab   Jacqualyn Posey 11/05/2022, 5:37 PM

## 2022-11-06 ENCOUNTER — Encounter (HOSPITAL_COMMUNITY): Payer: Self-pay | Admitting: Orthopedic Surgery

## 2022-11-10 DIAGNOSIS — R6889 Other general symptoms and signs: Secondary | ICD-10-CM | POA: Diagnosis not present

## 2022-11-10 DIAGNOSIS — R059 Cough, unspecified: Secondary | ICD-10-CM | POA: Diagnosis not present

## 2022-11-20 DIAGNOSIS — M1611 Unilateral primary osteoarthritis, right hip: Secondary | ICD-10-CM | POA: Diagnosis not present

## 2022-11-27 DIAGNOSIS — M6281 Muscle weakness (generalized): Secondary | ICD-10-CM | POA: Diagnosis not present

## 2022-11-27 DIAGNOSIS — R262 Difficulty in walking, not elsewhere classified: Secondary | ICD-10-CM | POA: Diagnosis not present

## 2022-11-27 DIAGNOSIS — M1611 Unilateral primary osteoarthritis, right hip: Secondary | ICD-10-CM | POA: Diagnosis not present

## 2022-11-27 DIAGNOSIS — M25651 Stiffness of right hip, not elsewhere classified: Secondary | ICD-10-CM | POA: Diagnosis not present

## 2022-12-02 DIAGNOSIS — M1611 Unilateral primary osteoarthritis, right hip: Secondary | ICD-10-CM | POA: Diagnosis not present

## 2022-12-02 DIAGNOSIS — M6281 Muscle weakness (generalized): Secondary | ICD-10-CM | POA: Diagnosis not present

## 2022-12-02 DIAGNOSIS — R262 Difficulty in walking, not elsewhere classified: Secondary | ICD-10-CM | POA: Diagnosis not present

## 2022-12-02 DIAGNOSIS — M25651 Stiffness of right hip, not elsewhere classified: Secondary | ICD-10-CM | POA: Diagnosis not present

## 2022-12-05 DIAGNOSIS — M1611 Unilateral primary osteoarthritis, right hip: Secondary | ICD-10-CM | POA: Diagnosis not present

## 2022-12-05 DIAGNOSIS — R262 Difficulty in walking, not elsewhere classified: Secondary | ICD-10-CM | POA: Diagnosis not present

## 2022-12-05 DIAGNOSIS — M25651 Stiffness of right hip, not elsewhere classified: Secondary | ICD-10-CM | POA: Diagnosis not present

## 2022-12-05 DIAGNOSIS — M6281 Muscle weakness (generalized): Secondary | ICD-10-CM | POA: Diagnosis not present

## 2022-12-08 DIAGNOSIS — M25651 Stiffness of right hip, not elsewhere classified: Secondary | ICD-10-CM | POA: Diagnosis not present

## 2022-12-08 DIAGNOSIS — M6281 Muscle weakness (generalized): Secondary | ICD-10-CM | POA: Diagnosis not present

## 2022-12-08 DIAGNOSIS — M1611 Unilateral primary osteoarthritis, right hip: Secondary | ICD-10-CM | POA: Diagnosis not present

## 2022-12-08 DIAGNOSIS — R262 Difficulty in walking, not elsewhere classified: Secondary | ICD-10-CM | POA: Diagnosis not present

## 2022-12-10 DIAGNOSIS — M25651 Stiffness of right hip, not elsewhere classified: Secondary | ICD-10-CM | POA: Diagnosis not present

## 2022-12-10 DIAGNOSIS — M1611 Unilateral primary osteoarthritis, right hip: Secondary | ICD-10-CM | POA: Diagnosis not present

## 2022-12-10 DIAGNOSIS — R262 Difficulty in walking, not elsewhere classified: Secondary | ICD-10-CM | POA: Diagnosis not present

## 2022-12-10 DIAGNOSIS — M6281 Muscle weakness (generalized): Secondary | ICD-10-CM | POA: Diagnosis not present

## 2022-12-15 DIAGNOSIS — M1611 Unilateral primary osteoarthritis, right hip: Secondary | ICD-10-CM | POA: Diagnosis not present

## 2022-12-15 DIAGNOSIS — R262 Difficulty in walking, not elsewhere classified: Secondary | ICD-10-CM | POA: Diagnosis not present

## 2022-12-15 DIAGNOSIS — M6281 Muscle weakness (generalized): Secondary | ICD-10-CM | POA: Diagnosis not present

## 2022-12-15 DIAGNOSIS — M25651 Stiffness of right hip, not elsewhere classified: Secondary | ICD-10-CM | POA: Diagnosis not present

## 2022-12-18 DIAGNOSIS — M1611 Unilateral primary osteoarthritis, right hip: Secondary | ICD-10-CM | POA: Diagnosis not present

## 2022-12-24 DIAGNOSIS — M1611 Unilateral primary osteoarthritis, right hip: Secondary | ICD-10-CM | POA: Diagnosis not present

## 2022-12-24 DIAGNOSIS — M6281 Muscle weakness (generalized): Secondary | ICD-10-CM | POA: Diagnosis not present

## 2022-12-24 DIAGNOSIS — M25651 Stiffness of right hip, not elsewhere classified: Secondary | ICD-10-CM | POA: Diagnosis not present

## 2022-12-24 DIAGNOSIS — R262 Difficulty in walking, not elsewhere classified: Secondary | ICD-10-CM | POA: Diagnosis not present

## 2022-12-29 DIAGNOSIS — M1611 Unilateral primary osteoarthritis, right hip: Secondary | ICD-10-CM | POA: Diagnosis not present

## 2022-12-29 DIAGNOSIS — M25651 Stiffness of right hip, not elsewhere classified: Secondary | ICD-10-CM | POA: Diagnosis not present

## 2022-12-29 DIAGNOSIS — M6281 Muscle weakness (generalized): Secondary | ICD-10-CM | POA: Diagnosis not present

## 2022-12-29 DIAGNOSIS — R262 Difficulty in walking, not elsewhere classified: Secondary | ICD-10-CM | POA: Diagnosis not present

## 2023-01-12 DIAGNOSIS — R29810 Facial weakness: Secondary | ICD-10-CM | POA: Diagnosis not present

## 2023-01-12 DIAGNOSIS — M1611 Unilateral primary osteoarthritis, right hip: Secondary | ICD-10-CM | POA: Diagnosis not present

## 2023-01-12 DIAGNOSIS — Z5321 Procedure and treatment not carried out due to patient leaving prior to being seen by health care provider: Secondary | ICD-10-CM | POA: Diagnosis not present

## 2023-01-12 DIAGNOSIS — Z79899 Other long term (current) drug therapy: Secondary | ICD-10-CM | POA: Diagnosis not present

## 2023-01-12 DIAGNOSIS — M25651 Stiffness of right hip, not elsewhere classified: Secondary | ICD-10-CM | POA: Diagnosis not present

## 2023-01-12 DIAGNOSIS — R531 Weakness: Secondary | ICD-10-CM | POA: Diagnosis not present

## 2023-01-12 DIAGNOSIS — J449 Chronic obstructive pulmonary disease, unspecified: Secondary | ICD-10-CM | POA: Diagnosis not present

## 2023-01-12 DIAGNOSIS — R262 Difficulty in walking, not elsewhere classified: Secondary | ICD-10-CM | POA: Diagnosis not present

## 2023-01-12 DIAGNOSIS — I10 Essential (primary) hypertension: Secondary | ICD-10-CM | POA: Diagnosis not present

## 2023-01-12 DIAGNOSIS — M6281 Muscle weakness (generalized): Secondary | ICD-10-CM | POA: Diagnosis not present

## 2023-01-12 DIAGNOSIS — R29898 Other symptoms and signs involving the musculoskeletal system: Secondary | ICD-10-CM | POA: Diagnosis not present

## 2023-01-17 DIAGNOSIS — R29810 Facial weakness: Secondary | ICD-10-CM | POA: Diagnosis not present

## 2023-01-29 DIAGNOSIS — M1611 Unilateral primary osteoarthritis, right hip: Secondary | ICD-10-CM | POA: Diagnosis not present

## 2023-02-04 DIAGNOSIS — H2513 Age-related nuclear cataract, bilateral: Secondary | ICD-10-CM | POA: Diagnosis not present

## 2023-02-04 DIAGNOSIS — H04122 Dry eye syndrome of left lacrimal gland: Secondary | ICD-10-CM | POA: Diagnosis not present

## 2023-02-26 DIAGNOSIS — R7303 Prediabetes: Secondary | ICD-10-CM | POA: Diagnosis not present

## 2023-02-26 DIAGNOSIS — K760 Fatty (change of) liver, not elsewhere classified: Secondary | ICD-10-CM | POA: Diagnosis not present

## 2023-02-26 DIAGNOSIS — Z139 Encounter for screening, unspecified: Secondary | ICD-10-CM | POA: Diagnosis not present

## 2023-02-26 DIAGNOSIS — H539 Unspecified visual disturbance: Secondary | ICD-10-CM | POA: Diagnosis not present

## 2023-02-26 DIAGNOSIS — R5383 Other fatigue: Secondary | ICD-10-CM | POA: Diagnosis not present

## 2023-02-26 DIAGNOSIS — G4700 Insomnia, unspecified: Secondary | ICD-10-CM | POA: Diagnosis not present

## 2023-02-26 DIAGNOSIS — I10 Essential (primary) hypertension: Secondary | ICD-10-CM | POA: Diagnosis not present

## 2023-02-26 DIAGNOSIS — E785 Hyperlipidemia, unspecified: Secondary | ICD-10-CM | POA: Diagnosis not present

## 2023-02-26 DIAGNOSIS — G4733 Obstructive sleep apnea (adult) (pediatric): Secondary | ICD-10-CM | POA: Diagnosis not present

## 2023-03-24 DIAGNOSIS — I77810 Thoracic aortic ectasia: Secondary | ICD-10-CM | POA: Diagnosis not present

## 2023-03-24 DIAGNOSIS — I7121 Aneurysm of the ascending aorta, without rupture: Secondary | ICD-10-CM | POA: Diagnosis not present

## 2023-03-24 DIAGNOSIS — Q2543 Congenital aneurysm of aorta: Secondary | ICD-10-CM | POA: Diagnosis not present

## 2023-03-24 DIAGNOSIS — J479 Bronchiectasis, uncomplicated: Secondary | ICD-10-CM | POA: Diagnosis not present

## 2023-03-24 DIAGNOSIS — I351 Nonrheumatic aortic (valve) insufficiency: Secondary | ICD-10-CM | POA: Diagnosis not present

## 2023-03-24 DIAGNOSIS — Z8679 Personal history of other diseases of the circulatory system: Secondary | ICD-10-CM | POA: Diagnosis not present

## 2023-03-24 DIAGNOSIS — Q263 Partial anomalous pulmonary venous connection: Secondary | ICD-10-CM | POA: Diagnosis not present

## 2023-03-24 DIAGNOSIS — I517 Cardiomegaly: Secondary | ICD-10-CM | POA: Diagnosis not present

## 2023-04-02 DIAGNOSIS — R001 Bradycardia, unspecified: Secondary | ICD-10-CM | POA: Diagnosis not present

## 2023-04-02 DIAGNOSIS — I10 Essential (primary) hypertension: Secondary | ICD-10-CM | POA: Diagnosis not present

## 2023-04-02 DIAGNOSIS — R5383 Other fatigue: Secondary | ICD-10-CM | POA: Diagnosis not present

## 2023-04-02 DIAGNOSIS — Z23 Encounter for immunization: Secondary | ICD-10-CM | POA: Diagnosis not present

## 2023-06-25 DIAGNOSIS — G4733 Obstructive sleep apnea (adult) (pediatric): Secondary | ICD-10-CM | POA: Diagnosis not present

## 2023-07-04 ENCOUNTER — Emergency Department
Admission: EM | Admit: 2023-07-04 | Discharge: 2023-07-04 | Disposition: A | Discharge status: Home / Self Care | Payer: Medicare Other | Attending: Emergency Medicine | Admitting: Emergency Medicine

## 2023-07-04 ENCOUNTER — Emergency Department: Payer: Medicare Other

## 2023-07-04 ENCOUNTER — Other Ambulatory Visit: Payer: Self-pay

## 2023-07-04 DIAGNOSIS — R079 Chest pain, unspecified: Secondary | ICD-10-CM

## 2023-07-04 DIAGNOSIS — Z20822 Contact with and (suspected) exposure to covid-19: Secondary | ICD-10-CM | POA: Diagnosis not present

## 2023-07-04 DIAGNOSIS — R918 Other nonspecific abnormal finding of lung field: Secondary | ICD-10-CM | POA: Diagnosis not present

## 2023-07-04 DIAGNOSIS — R0789 Other chest pain: Secondary | ICD-10-CM | POA: Diagnosis not present

## 2023-07-04 DIAGNOSIS — J189 Pneumonia, unspecified organism: Secondary | ICD-10-CM | POA: Insufficient documentation

## 2023-07-04 DIAGNOSIS — R110 Nausea: Secondary | ICD-10-CM | POA: Diagnosis not present

## 2023-07-04 LAB — CBC WITH DIFFERENTIAL/PLATELET
Abs Immature Granulocytes: 0.04 10*3/uL (ref 0.00–0.07)
Basophils Absolute: 0.1 10*3/uL (ref 0.0–0.1)
Basophils Relative: 1 %
Eosinophils Absolute: 0.4 10*3/uL (ref 0.0–0.5)
Eosinophils Relative: 5 %
HCT: 44.7 % (ref 39.0–52.0)
Hemoglobin: 15.2 g/dL (ref 13.0–17.0)
Immature Granulocytes: 1 %
Lymphocytes Relative: 13 %
Lymphs Abs: 1.1 10*3/uL (ref 0.7–4.0)
MCH: 28.3 pg (ref 26.0–34.0)
MCHC: 34 g/dL (ref 30.0–36.0)
MCV: 83.2 fL (ref 80.0–100.0)
Monocytes Absolute: 1.3 10*3/uL — ABNORMAL HIGH (ref 0.1–1.0)
Monocytes Relative: 16 %
Neutro Abs: 5.3 10*3/uL (ref 1.7–7.7)
Neutrophils Relative %: 64 %
Platelets: 201 10*3/uL (ref 150–400)
RBC: 5.37 MIL/uL (ref 4.22–5.81)
RDW: 13.4 % (ref 11.5–15.5)
WBC: 8.2 10*3/uL (ref 4.0–10.5)
nRBC: 0 % (ref 0.0–0.2)

## 2023-07-04 LAB — RESP PANEL BY RT-PCR (RSV, FLU A&B, COVID)  RVPGX2
Influenza A by PCR: NEGATIVE
Influenza B by PCR: NEGATIVE
Resp Syncytial Virus by PCR: NEGATIVE
SARS Coronavirus 2 by RT PCR: NEGATIVE

## 2023-07-04 LAB — BASIC METABOLIC PANEL
Anion gap: 12 (ref 5–15)
BUN: 15 mg/dL (ref 8–23)
CO2: 21 mmol/L — ABNORMAL LOW (ref 22–32)
Calcium: 8.5 mg/dL — ABNORMAL LOW (ref 8.9–10.3)
Chloride: 106 mmol/L (ref 98–111)
Creatinine, Ser: 0.76 mg/dL (ref 0.61–1.24)
GFR, Estimated: >60 mL/min (ref >60)
Glucose, Bld: 94 mg/dL (ref 70–99)
Potassium: 3.7 mmol/L (ref 3.5–5.1)
Sodium: 139 mmol/L (ref 135–145)

## 2023-07-04 LAB — TROPONIN I (HIGH SENSITIVITY): Troponin I (High Sensitivity): 4 ng/L (ref <18)

## 2023-07-04 MED ORDER — DOXYCYCLINE HYCLATE 100 MG PO TABS: 100.0000 mg | ORAL_TABLET | Freq: Two times a day (BID) | ORAL | 0 refills | Status: AC

## 2023-07-04 MED ORDER — AMOXICILLIN-POT CLAVULANATE 875-125 MG PO TABS: 1.0000 | ORAL_TABLET | Freq: Two times a day (BID) | ORAL | 0 refills | Status: AC

## 2023-07-04 NOTE — ED Notes (Signed)
MD funke at bedside.

## 2023-07-04 NOTE — ED Provider Triage Note (Signed)
Emergency Medicine Provider Triage Evaluation Note  Richard Gallagher , a 68 y.o. male  was evaluated in triage.  Pt complains of CP x2 days, described as squeezing. 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 Has lessoned in intensity but still there, 3/10. +SOB, +nausea yesterday. Wife reports diaphoretic yesterday. Radiates to left shoulder, no back pain or jaw pain.  Review of Systems  Positive: Chest pain, SOB, nausea Negative: Back pain  Physical Exam  There were no vitals taken for this visit. Gen:   Awake, no distress   Resp:  Normal effort  MSK:   Moves extremities without difficulty  Other:    Medical Decision Making  Medically screening exam initiated at 1:52 PM.  Appropriate orders placed.  Richard Gallagher was informed that the remainder of the evaluation will be completed by another provider, this initial triage assessment does not replace that evaluation, and the importance of remaining in the ED until their evaluation is complete.     Jackelyn Hoehn, PA-C 07/04/23 1354

## 2023-07-04 NOTE — ED Triage Notes (Signed)
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 Pt reports yesterday he was diaphoretic, Pt reports aortic aneurism. Left shoulder soreness. Pt talks in complete sentences no respiratory distress noted

## 2023-07-04 NOTE — ED Provider Notes (Signed)
[facility] Provider Note    Event Date/Time   First MD Initiated Contact with Patient 07/04/23 1642     (approximate)   History   Chest Pain   HPI  Richard Gallagher is a 68 y.o. male with a history of aortic aneurysm who comes in with chest pain.  Contrary to triage note the symptoms started on Thursday.  He reports that actually started with feeling bloated, little bit of nausea.  He states that he had appendicitis before and it kind of felt like he had appendicitis.  He states that he had some chills.  He states that he went to bed and then he started developing some chest pain.  The pain was nonradiating.  He states he felt a little short of breath had a little bit of a cough.  He states that he was just waiting for him to die but because he did not want to come to the hospital.  He states that his symptoms have since all resolved but his family member made him come into the emergency room to make sure he did not have had a heart attack.  He denies any symptoms at this time.  No chest pain, no radiation of pain to the back.  Contrary to triage note he has no shoulder pain.  He reports complete resolution of symptoms and just wanted to come in for a troponin to make sure he did not have a heart attack.   Physical Exam   Triage Vital Signs: ED Triage Vitals  Encounter Vitals Group     BP 07/04/23 1354 132/72     Systolic BP Percentile --      Diastolic BP Percentile --      Pulse Rate 07/04/23 1354 68     Resp 07/04/23 1354 18     Temp 07/04/23 1354 98.6 F (37 C)     Temp Source 07/04/23 1354 Oral     SpO2 07/04/23 1354 95 %     Weight 07/04/23 1352 240 lb (108.9 kg)     Height 07/04/23 1352 5\' 9"  (1.753 m)     Head Circumference --      Peak Flow --      Pain Score 07/04/23 1352 3     Pain Loc --      Pain Education --      Exclude from Growth Chart --     Most recent vital signs: Vitals:   07/04/23 1354  BP: 132/72  Pulse: 68  Resp: 18   Temp: 98.6 F (37 C)  SpO2: 95%     General: Awake, no distress.  CV:  Good peripheral perfusion.  Resp:  Normal effort.  Clear lungs Abd:  No distention.  Soft and nontender Other:  Equal radial pulses sensation intact throughout.  No swelling in legs.  No calf tenderness. No swelling in legs no calf tendernes   ED Results / Procedures / Treatments   Labs (all labs ordered are listed, but only abnormal results are displayed) Labs Reviewed  BASIC METABOLIC PANEL - Abnormal; Notable for the following components:      Result Value   CO2 21 (*)    Calcium 8.5 (*)    All other components within normal limits  CBC WITH DIFFERENTIAL/PLATELET - Abnormal; Notable for the following components:   Monocytes Absolute 1.3 (*)    All other components within normal limits  TROPONIN I (HIGH SENSITIVITY)  TROPONIN I (HIGH SENSITIVITY)  EKG  My interpretation of EKG:  Normal sinus rate of 83 without any ST elevation or T wave versions, normal intervals  RADIOLOGY I have reviewed the xray personally and interpreted and patient has a little bit of bilateral opacities could be pneumonia versus aspiration versus atelectasis.  PROCEDURES:  Critical Care performed: No  Procedures   MEDICATIONS ORDERED IN ED: Medications - No data to display   IMPRESSION / MDM / ASSESSMENT AND PLAN / ED COURSE  I reviewed the triage vital signs and the nursing notes.   Patient's presentation is most consistent with acute presentation with potential threat to life or bodily function.   Patient comes in with an episode of chest pain this is now resolved.  EKG without evidence of STEMI.  Inferior leads are similar to prior.  Troponin was negative and symptoms have been going on for greater than 3 hours therefore unlikely ACS.  We discussed repeat troponin but patient declined.  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  We discussed the risk for death, permanent disability.  Wife is there to witness conversation however patient states that he does not want to have this done.  He states that he feels that his exact normal self and that he will return if symptoms return but at this time he declines.  His blood work is reassuring.  He has a capacity make this decision.  His x-ray does show some possible pneumonia versus aspiration versus atelectasis.  He does report that his cough felt like when he had had COVID but he just feels like it got a better too fast to be COVID.  We discussed doing COVID testing.  We discussed the x-ray and no risk factors for aspiration.  Possible pneumonia.  Will cover with antibiotics just in case and he will return if symptoms are worsening but he understands the risk of holding off on CT imaging but is adamant that he does not want to have that done today.  I discussed with patient that I cannot predict if patient has narrowing of his heart vessels and he is to call cardiology to make a follow-up appointment to discuss stress testing.  CBC is reassuring.  BMP reassuring troponin negative  The patient is on the cardiac monitor to evaluate for evidence of arrhythmia and/or significant heart rate changes.      FINAL CLINICAL IMPRESSION(S) / ED DIAGNOSES   Final diagnoses:  Nonspecific chest pain  Pneumonia due to infectious organism, unspecified laterality, unspecified part of lung     Rx / DC Orders   ED Discharge Orders     None        Note:  This document was prepared using Dragon voice recognition software and may include unintentional dictation errors.   Concha Se, MD 07/04/23 [phone]

## 2023-07-04 NOTE — Discharge Instructions (Addendum)
We discussed getting a CT imaging to evaluate for pulmonary embolism, your aneurysm but you have opted to decline.  We are starting you on some antibiotics in case there is a developing pneumonia, your COVID test is pending.  You should return to the ER if you develop worsening symptoms or any other concerns  IMPRESSION: Bibasilar linear and right-greater-than-left patchy opacities, which may represent atelectasis, aspiration, or pneumonia.

## 2023-07-04 NOTE — ED Notes (Signed)
..  The patient is A&OX4, ambulatory at d/c with independent steady gait, NAD. Pt verbalized understanding of d/c instructions, prescriptions and follow up care.

## 2023-07-07 DIAGNOSIS — G4700 Insomnia, unspecified: Secondary | ICD-10-CM | POA: Diagnosis not present

## 2023-07-07 DIAGNOSIS — E785 Hyperlipidemia, unspecified: Secondary | ICD-10-CM | POA: Diagnosis not present

## 2023-07-07 DIAGNOSIS — R7303 Prediabetes: Secondary | ICD-10-CM | POA: Diagnosis not present

## 2023-07-07 DIAGNOSIS — G4733 Obstructive sleep apnea (adult) (pediatric): Secondary | ICD-10-CM | POA: Diagnosis not present

## 2023-07-07 DIAGNOSIS — I252 Old myocardial infarction: Secondary | ICD-10-CM | POA: Diagnosis not present

## 2023-07-07 DIAGNOSIS — I10 Essential (primary) hypertension: Secondary | ICD-10-CM | POA: Diagnosis not present

## 2023-07-07 DIAGNOSIS — K760 Fatty (change of) liver, not elsewhere classified: Secondary | ICD-10-CM | POA: Diagnosis not present

## 2023-09-14 ENCOUNTER — Ambulatory Visit: Payer: Medicare Other | Admitting: Cardiovascular Disease

## 2023-09-14 ENCOUNTER — Encounter: Payer: Self-pay | Admitting: Cardiovascular Disease

## 2023-09-14 VITALS — BP 138/74 | HR 78 | Ht 69.0 in | Wt 249.0 lb

## 2023-09-14 DIAGNOSIS — R0789 Other chest pain: Secondary | ICD-10-CM | POA: Diagnosis not present

## 2023-09-14 DIAGNOSIS — R001 Bradycardia, unspecified: Secondary | ICD-10-CM | POA: Diagnosis not present

## 2023-09-14 DIAGNOSIS — I252 Old myocardial infarction: Secondary | ICD-10-CM

## 2023-09-14 DIAGNOSIS — I7121 Aneurysm of the ascending aorta, without rupture: Secondary | ICD-10-CM

## 2023-09-14 DIAGNOSIS — R0609 Other forms of dyspnea: Secondary | ICD-10-CM | POA: Diagnosis not present

## 2023-09-14 DIAGNOSIS — I10 Essential (primary) hypertension: Secondary | ICD-10-CM

## 2023-09-14 DIAGNOSIS — E785 Hyperlipidemia, unspecified: Secondary | ICD-10-CM

## 2023-09-14 MED ORDER — LOSARTAN POTASSIUM 100 MG PO TABS: 100.0000 mg | ORAL_TABLET | Freq: Every day | ORAL | 11 refills | Status: AC

## 2023-09-14 NOTE — Progress Notes (Signed)
 Cardiology Office Note   Date:  09/14/2023   ID:  Richard Gallagher, DOB Jan 18, 1956, MRN [id number]  PCP:  Lonie Peak, PA-C  Cardiologist:  Adrian Blackwater, MD      History of Present Illness: Richard Gallagher is a 68 y.o. male who presents for No chief complaint on file.   67 YOWM normally seen by Dr. Lady Gary, with h/o Aortic aneurysm, CAD, came with chest pain and establish as patient.  Chest Pain  This is a chronic problem. The current episode started more than 1 year ago. The onset quality is sudden. The problem occurs intermittently. The problem has been waxing and waning. The pain is present in the substernal region. The pain is at a severity of 4/10. The quality of the pain is described as tightness.      Past Medical History:  Diagnosis Date   Acute appendicitis    Aortic atherosclerosis (HCC)    Arthritis    Collagen vascular disease (HCC)    COPD (chronic obstructive pulmonary disease) (HCC)    Dilated aortic root (HCC)    Dysrhythmia    right bundle branch block...(not new)   Essential hypertension 02/04/2017   Fatty liver    Headache    takes atenolol for this   Hyperglycemia    Moderate obesity    Had been better controlled while he was living in [name] He gained 30 pounds since returning in January 2018   Myocardial infarction [facility]) 2016   lower right hand side of heart...no damage done   Right bundle branch block (RBBB) on electrocardiogram (ECG) 05/2016   Ruptured appendicitis 06/20/2017   Sleep apnea    NO CPAP.  instructed years ago to use. lost weight   Thoracic aortic aneurysm [facility])    Thoracic aortic aneurysm without rupture (HCC) 05/2016   a. noted on Cardiac Cath with TAA Angio - Aortic Root 5.1 cm. b. echo 02/2017 - dilated aortic root (46mm) and ascending aorta (43mm).     Past Surgical History:  Procedure Laterality Date   BACK SURGERY  1995   LOWER   CARDIAC CATHETERIZATION  05/2016    [facility] [place], [name] Dr. Harrell Lark) 05/22/16: EF 55%. Normal coronary arteries. Dilated aortic root at 5.1 cm. 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 codominant   COLONOSCOPY WITH PROPOFOL N/A 08/04/2017   Procedure: COLONOSCOPY WITH PROPOFOL;  Surgeon: Toney Reil, MD;  Location: [facility] ENDOSCOPY;  Service: Gastroenterology;  Laterality: N/A;   COLONOSCOPY WITH PROPOFOL N/A 01/11/2018   Procedure: COLONOSCOPY WITH PROPOFOL;  Surgeon: Toney Reil, MD;  Location: [facility] ENDOSCOPY;  Service: Gastroenterology;  Laterality: N/A;   COLONOSCOPY WITH PROPOFOL N/A 10/08/2021   Procedure: COLONOSCOPY WITH PROPOFOL;  Surgeon: Toney Reil, MD;  Location: [facility] ENDOSCOPY;  Service: Gastroenterology;  Laterality: N/A;   LAPAROSCOPIC APPENDECTOMY N/A 02/19/2018   Procedure: APPENDECTOMY LAPAROSCOPIC;  Surgeon: Lattie Haw, MD;  Location: [facility] ORS;  Service: General;  Laterality: N/A;   ROTATOR CUFF REPAIR Left 04/2017   TOTAL HIP ARTHROPLASTY Right 11/05/2022   Procedure: TOTAL HIP ARTHROPLASTY;  Surgeon: Joen Laura, MD;  Location: [facility] ORS;  Service: Orthopedics;  Laterality: Right;   TRANSTHORACIC ECHOCARDIOGRAM  02/2018    Normal LV size with mild LVH -No RWMA.  GR 1 DD. Marland Kitchen  EF 55 to 60%.  Mild RV dilation with mild decrease function.  Moderately dilated aortic root (estimated 48 mm with ascending aorta estimated at  42 mm)   UMBILICAL HERNIA REPAIR N/A 02/19/2018   Procedure: HERNIA REPAIR UMBILICAL ADULT;  Surgeon: Lattie Haw, MD;  Location: [facility] ORS;  Service: General;  Laterality: N/A;     Current Outpatient Medications  Medication Sig Dispense Refill   amLODipine (NORVASC) 5 MG tablet Take 5 mg by mouth daily.     losartan (COZAAR) 100 MG tablet Take 1 tablet (100 mg total) by mouth daily. 30 tablet 11   Multiple Vitamins-Minerals (MENS 50+ MULTIVITAMIN) TABS Take 1 tablet by mouth daily.     Omega-3 Fatty Acids (FISH OIL) 1200 MG CAPS Take 1,200 mg by mouth daily.      sildenafil (VIAGRA) 100 MG tablet Take 100 mg by mouth as needed for erectile dysfunction.     zolpidem (AMBIEN) 10 MG tablet Take 10 mg by mouth at bedtime.  1   omeprazole (PRILOSEC) 40 MG capsule Take 1 capsule (40 mg total) by mouth daily for 28 days. 28 capsule 0   polyethylene glycol (MIRALAX) 17 g packet Take 17 g by mouth daily. (Patient not taking: Reported on 09/14/2023) 14 each 0   No current facility-administered medications for this visit.    Allergies:   Patient has no known allergies.    Social History:   reports that he has never smoked. He has never used smokeless tobacco. He reports current alcohol use. He reports that he does not use drugs.   Family History:  family history includes Arthritis in his father and mother; Diabetes in his father and mother; Heart disease in his mother; Hypertension in his father and mother; Stroke in his brother.    ROS:     Review of Systems  Constitutional: Negative.   HENT: Negative.    Eyes: Negative.   Respiratory: Negative.    Cardiovascular:  Positive for chest pain.  Gastrointestinal: Negative.   Genitourinary: Negative.   Musculoskeletal: Negative.   Skin: Negative.   Neurological: Negative.   Endo/Heme/Allergies: Negative.   Psychiatric/Behavioral: Negative.    All other systems reviewed and are negative.     All other systems are reviewed and negative.    PHYSICAL EXAM: VS:  BP 138/74   Pulse 78   Ht 5\' 9"  (1.753 m)   Wt 249 lb (112.9 kg)   SpO2 94%   BMI 36.77 kg/m  , BMI Body mass index is 36.77 kg/m. Last weight:  Wt Readings from Last 3 Encounters:  09/14/23 249 lb (112.9 kg)  07/04/23 240 lb (108.9 kg)  11/05/22 247 lb (112 kg)     Physical Exam Vitals reviewed.  Constitutional:      Appearance: Normal appearance. He is normal weight.  HENT:     Head: Normocephalic.     Nose: Nose normal.     Mouth/Throat:     Mouth: Mucous membranes are moist.  Eyes:     Pupils: Pupils are equal, round, and  reactive to light.  Cardiovascular:     Rate and Rhythm: Normal rate and regular rhythm.     Pulses: Normal pulses.     Heart sounds: Normal heart sounds.  Pulmonary:     Effort: Pulmonary effort is normal.  Abdominal:     General: Abdomen is flat. Bowel sounds are normal.  Musculoskeletal:        General: Normal range of motion.     Cervical back: Normal range of motion.  Skin:    General: Skin is warm.  Neurological:     General: No focal  deficit present.     Mental Status: He is alert.  Psychiatric:        Mood and Affect: Mood normal.       EKG: 1/25 NSR old IWMI, non specific st cahnges  Recent Labs: 10/24/2022: ALT 26 07/04/2023: BUN 15; Creatinine, Ser 0.76; Hemoglobin 15.2; Platelets 201; Potassium 3.7; Sodium 139    Lipid Panel No results found for: "CHOL", "TRIG", "HDL", "CHOLHDL", "VLDL", "LDLCALC", "LDLDIRECT"    Other studies Reviewed: Additional studies/ records that were reviewed today include:  Review of the above records demonstrates:      02/02/2017    8:58 AM  PAD Screen  Previous PAD dx? No  Previous surgical procedure? No  Pain with walking? No  Feet/toe relief with dangling? No  Painful, non-healing ulcers? No  Extremities discolored? No      ASSESSMENT AND PLAN:    ICD-10-CM   1. Other chest pain  R07.89 PCV ECHOCARDIOGRAM COMPLETE    MYOCARDIAL PERFUSION IMAGING    losartan (COZAAR) 100 MG tablet   echo, stress test as has chest paibn with old EKG having IWMI    2. Aneurysm of ascending aorta without rupture (HCC)  I71.21 PCV ECHOCARDIOGRAM COMPLETE    MYOCARDIAL PERFUSION IMAGING    losartan (COZAAR) 100 MG tablet    3. Essential hypertension  I10 PCV ECHOCARDIOGRAM COMPLETE    MYOCARDIAL PERFUSION IMAGING    losartan (COZAAR) 100 MG tablet   change losartan to 100 once a day as need BP 120/70    4. DOE (dyspnea on exertion)  R06.09 PCV ECHOCARDIOGRAM COMPLETE    MYOCARDIAL PERFUSION IMAGING    losartan (COZAAR) 100 MG  tablet    5. Dyslipidemia (high LDL; low HDL)  E78.5 PCV ECHOCARDIOGRAM COMPLETE    MYOCARDIAL PERFUSION IMAGING    losartan (COZAAR) 100 MG tablet    6. History of acute inferior wall MI  I25.2 PCV ECHOCARDIOGRAM COMPLETE    MYOCARDIAL PERFUSION IMAGING    losartan (COZAAR) 100 MG tablet    7. Sinus bradycardia  R00.1 losartan (COZAAR) 100 MG tablet   h/o of HR 40s, thus atenolol stopped       Problem List Items Addressed This Visit       Cardiovascular and Mediastinum   Thoracic aortic aneurysm without rupture (HCC) (Chronic)   Relevant Medications   sildenafil (VIAGRA) 100 MG tablet   losartan (COZAAR) 100 MG tablet   Other Relevant Orders   PCV ECHOCARDIOGRAM COMPLETE   MYOCARDIAL PERFUSION IMAGING   Essential hypertension (Chronic)   Relevant Medications   sildenafil (VIAGRA) 100 MG tablet   losartan (COZAAR) 100 MG tablet   Other Relevant Orders   PCV ECHOCARDIOGRAM COMPLETE   MYOCARDIAL PERFUSION IMAGING     Other   DOE (dyspnea on exertion) (Chronic)   Relevant Medications   losartan (COZAAR) 100 MG tablet   Other Relevant Orders   PCV ECHOCARDIOGRAM COMPLETE   MYOCARDIAL PERFUSION IMAGING   Dyslipidemia (high LDL; low HDL) (Chronic)   Relevant Medications   losartan (COZAAR) 100 MG tablet   Other Relevant Orders   PCV ECHOCARDIOGRAM COMPLETE   MYOCARDIAL PERFUSION IMAGING   Other Visit Diagnoses       Other chest pain    -  Primary   echo, stress test as has chest paibn with old EKG having IWMI   Relevant Medications   losartan (COZAAR) 100 MG tablet   Other Relevant Orders   PCV ECHOCARDIOGRAM COMPLETE   MYOCARDIAL PERFUSION IMAGING  History of acute inferior wall MI       Relevant Medications   losartan (COZAAR) 100 MG tablet   Other Relevant Orders   PCV ECHOCARDIOGRAM COMPLETE   MYOCARDIAL PERFUSION IMAGING     Sinus bradycardia       h/o of HR 40s, thus atenolol stopped   Relevant Medications   sildenafil (VIAGRA) 100 MG tablet    losartan (COZAAR) 100 MG tablet          Disposition:   Return in about 2 weeks (around 09/28/2023) for echo, stress test and f/u.    Total time spent: 50 minutes  Signed,  Adrian Blackwater, MD  09/14/2023 10:26 AM    [facility]

## 2023-09-22 ENCOUNTER — Ambulatory Visit

## 2023-09-22 DIAGNOSIS — E785 Hyperlipidemia, unspecified: Secondary | ICD-10-CM

## 2023-09-22 DIAGNOSIS — I361 Nonrheumatic tricuspid (valve) insufficiency: Secondary | ICD-10-CM | POA: Diagnosis not present

## 2023-09-22 DIAGNOSIS — I340 Nonrheumatic mitral (valve) insufficiency: Secondary | ICD-10-CM

## 2023-09-22 DIAGNOSIS — I252 Old myocardial infarction: Secondary | ICD-10-CM

## 2023-09-22 DIAGNOSIS — I351 Nonrheumatic aortic (valve) insufficiency: Secondary | ICD-10-CM | POA: Diagnosis not present

## 2023-09-22 DIAGNOSIS — R0789 Other chest pain: Secondary | ICD-10-CM

## 2023-09-22 DIAGNOSIS — I7121 Aneurysm of the ascending aorta, without rupture: Secondary | ICD-10-CM

## 2023-09-22 DIAGNOSIS — I10 Essential (primary) hypertension: Secondary | ICD-10-CM

## 2023-09-22 DIAGNOSIS — R0609 Other forms of dyspnea: Secondary | ICD-10-CM

## 2023-09-28 ENCOUNTER — Ambulatory Visit

## 2023-10-01 ENCOUNTER — Ambulatory Visit: Admitting: Cardiovascular Disease

## 2023-10-08 DIAGNOSIS — H6591 Unspecified nonsuppurative otitis media, right ear: Secondary | ICD-10-CM | POA: Diagnosis not present

## 2023-10-08 DIAGNOSIS — G4733 Obstructive sleep apnea (adult) (pediatric): Secondary | ICD-10-CM | POA: Diagnosis not present

## 2023-10-08 DIAGNOSIS — J029 Acute pharyngitis, unspecified: Secondary | ICD-10-CM | POA: Diagnosis not present

## 2023-10-08 DIAGNOSIS — J45909 Unspecified asthma, uncomplicated: Secondary | ICD-10-CM | POA: Diagnosis not present

## 2023-10-08 DIAGNOSIS — R6889 Other general symptoms and signs: Secondary | ICD-10-CM | POA: Diagnosis not present

## 2023-10-12 ENCOUNTER — Encounter

## 2023-10-15 DIAGNOSIS — J45909 Unspecified asthma, uncomplicated: Secondary | ICD-10-CM | POA: Diagnosis not present

## 2023-10-26 ENCOUNTER — Ambulatory Visit: Admitting: Cardiovascular Disease

## 2023-11-11 DIAGNOSIS — I10 Essential (primary) hypertension: Secondary | ICD-10-CM | POA: Diagnosis not present

## 2023-11-11 DIAGNOSIS — R1031 Right lower quadrant pain: Secondary | ICD-10-CM | POA: Diagnosis not present

## 2023-11-12 DIAGNOSIS — R1031 Right lower quadrant pain: Secondary | ICD-10-CM | POA: Diagnosis not present

## 2023-11-16 ENCOUNTER — Other Ambulatory Visit: Payer: Self-pay | Admitting: Physician Assistant

## 2023-11-16 ENCOUNTER — Telehealth: Payer: Self-pay

## 2023-11-16 DIAGNOSIS — R1031 Right lower quadrant pain: Secondary | ICD-10-CM

## 2023-11-16 NOTE — Telephone Encounter (Signed)
 Patient wife called stating that they had some medication questions,questions about the losartan ,   After looking back in the chart it looks like he might have previously been on 50mg  and was increased to 100mg  in April, will call the patient back to confirm

## 2023-11-19 ENCOUNTER — Ambulatory Visit

## 2023-11-19 DIAGNOSIS — I2489 Other forms of acute ischemic heart disease: Secondary | ICD-10-CM | POA: Diagnosis not present

## 2023-11-24 ENCOUNTER — Ambulatory Visit: Admitting: Cardiovascular Disease

## 2023-11-24 ENCOUNTER — Encounter: Payer: Self-pay | Admitting: Cardiovascular Disease

## 2023-11-24 VITALS — BP 130/79 | HR 68 | Ht 69.0 in | Wt 251.2 lb

## 2023-11-24 DIAGNOSIS — R0789 Other chest pain: Secondary | ICD-10-CM | POA: Diagnosis not present

## 2023-11-24 DIAGNOSIS — I252 Old myocardial infarction: Secondary | ICD-10-CM | POA: Diagnosis not present

## 2023-11-24 DIAGNOSIS — E785 Hyperlipidemia, unspecified: Secondary | ICD-10-CM | POA: Diagnosis not present

## 2023-11-24 DIAGNOSIS — I7121 Aneurysm of the ascending aorta, without rupture: Secondary | ICD-10-CM | POA: Diagnosis not present

## 2023-11-24 DIAGNOSIS — R0609 Other forms of dyspnea: Secondary | ICD-10-CM

## 2023-11-24 DIAGNOSIS — I10 Essential (primary) hypertension: Secondary | ICD-10-CM | POA: Diagnosis not present

## 2023-11-24 MED ORDER — ISOSORBIDE MONONITRATE ER 30 MG PO TB24
30.0000 mg | ORAL_TABLET | Freq: Every day | ORAL | 1 refills | Status: DC
Start: 2023-11-24 — End: 2023-12-24

## 2023-11-24 NOTE — Progress Notes (Signed)
 Cardiology Office Note   Date:  11/24/2023   ID:  [facility], DOB 06-20-1955, MRN [id number]  PCP:  Aloha Arnold, PA-C  Cardiologist:  Debborah Fairly, MD      History of Present Illness: [facility] is a 68 y.o. male who presents for No chief complaint on file.   Has SOB, but mostly on exertion. No chest pain.      Past Medical History:  Diagnosis Date   Acute appendicitis    Aortic atherosclerosis (HCC)    Arthritis    Collagen vascular disease (HCC)    COPD (chronic obstructive pulmonary disease) (HCC)    Dilated aortic root (HCC)    Dysrhythmia    right bundle branch block...(not new)   Essential hypertension 02/04/2017   Fatty liver    Headache    takes atenolol  for this   Hyperglycemia    Moderate obesity    Had been better controlled [place] 30 pounds since returning in January 2018   Myocardial infarction [facility]) 2016   lower right hand side of heart...no damage done   Right bundle branch block (RBBB) on electrocardiogram (ECG) 05/2016   Ruptured appendicitis 06/20/2017   Sleep apnea    NO CPAP.  instructed years ago to use. lost weight   Thoracic aortic aneurysm [facility])    Thoracic aortic aneurysm without rupture (HCC) 05/2016   a. noted on Cardiac Cath with TAA Angio - Aortic Root 5.1 cm. b. echo 02/2017 - dilated aortic root (46mm) and ascending aorta (43mm).     Past Surgical History:  Procedure Laterality Date   BACK SURGERY  1995   LOWER   CARDIAC CATHETERIZATION  05/2016    [facility] [place] Dr. Deleta Felix) 05/22/16: EF 55%. Normal coronary arteries. Dilated aortic root at 5.1 cm. 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 codominant   COLONOSCOPY WITH PROPOFOL  N/A 08/04/2017   Procedure: COLONOSCOPY WITH PROPOFOL ;  Surgeon: Selena Daily, MD;  Location: [facility];  Service: Gastroenterology;  Laterality: N/A;   COLONOSCOPY WITH PROPOFOL  N/A 01/11/2018    Procedure: COLONOSCOPY WITH PROPOFOL ;  Surgeon: Selena Daily, MD;  Location: [facility];  Service: Gastroenterology;  Laterality: N/A;   COLONOSCOPY WITH PROPOFOL  N/A 10/08/2021   Procedure: COLONOSCOPY WITH PROPOFOL ;  Surgeon: Selena Daily, MD;  [facility];  Service: Gastroenterology;  Laterality: N/A;   LAPAROSCOPIC APPENDECTOMY N/A 02/19/2018   Procedure: APPENDECTOMY LAPAROSCOPIC;  Surgeon: Claudia Cuff, MD;  Location: [facility];  Service: General;  Laterality: N/A;   ROTATOR CUFF REPAIR Left 04/2017   TOTAL HIP ARTHROPLASTY Right 11/05/2022   Procedure: TOTAL HIP ARTHROPLASTY;  Surgeon: Murleen Arms, MD;  [facility];  Service: Orthopedics;  Laterality: Right;   TRANSTHORACIC ECHOCARDIOGRAM  02/2018    Normal LV size with mild LVH -No RWMA.  GR 1 DD. Aaron Aas  EF 55 to 60%.  Mild RV dilation with mild decrease function.  Moderately dilated aortic root (estimated 48 mm with ascending aorta estimated at 42 mm)   UMBILICAL HERNIA REPAIR N/A 02/19/2018   Procedure: HERNIA REPAIR UMBILICAL ADULT;  Surgeon: Claudia Cuff, MD;  Location: [facility];  Service: General;  Laterality: N/A;     Current Outpatient Medications  Medication Sig Dispense Refill   amLODipine (NORVASC) 5 MG tablet Take 5 mg by mouth daily.     isosorbide mononitrate (IMDUR) 30 MG 24 hr tablet Take  1 tablet (30 mg total) by mouth daily. 30 tablet 1   losartan  (COZAAR ) 100 MG tablet Take 1 tablet (100 mg total) by mouth daily. 30 tablet 11   Multiple Vitamins-Minerals (MENS 50+ MULTIVITAMIN) TABS Take 1 tablet by mouth daily.     Omega-3 Fatty Acids (FISH OIL) 1200 MG CAPS Take 1,200 mg by mouth daily.     omeprazole  (PRILOSEC) 40 MG capsule Take 1 capsule (40 mg total) by mouth daily for 28 days. 28 capsule 0   zolpidem  (AMBIEN ) 10 MG tablet Take 10 mg by mouth at bedtime.  1   polyethylene glycol (MIRALAX ) 17 g packet Take 17 g by mouth daily. (Patient not taking: Reported on  11/24/2023) 14 each 0   No current facility-administered medications for this visit.    Allergies:   Lisinopril    Social History:   reports that he has never smoked. He has never used smokeless tobacco. He reports current alcohol  use. He reports that he does not use drugs.   Family History:  family history includes Arthritis in his father and mother; Diabetes in his father and mother; Heart disease in his mother; Hypertension in his father and mother; Stroke in his brother.    ROS:     Review of Systems  Constitutional: Negative.   HENT: Negative.    Eyes: Negative.   Respiratory: Negative.    Gastrointestinal: Negative.   Genitourinary: Negative.   Musculoskeletal: Negative.   Skin: Negative.   Neurological: Negative.   Endo/Heme/Allergies: Negative.   Psychiatric/Behavioral: Negative.    All other systems reviewed and are negative.     All other systems are reviewed and negative.    PHYSICAL EXAM: VS:  BP 130/79   Pulse 68   Ht 5' 9 (1.753 m)   Wt 251 lb 3.2 oz (113.9 kg)   SpO2 96%   BMI 37.10 kg/m  , BMI Body mass index is 37.1 kg/m. Last weight:  Wt Readings from Last 3 Encounters:  11/24/23 251 lb 3.2 oz (113.9 kg)  09/14/23 249 lb (112.9 kg)  07/04/23 240 lb (108.9 kg)     Physical Exam Vitals reviewed.  Constitutional:      Appearance: Normal appearance. He is normal weight.  HENT:     Head: Normocephalic.     Nose: Nose normal.     Mouth/Throat:     Mouth: Mucous membranes are moist.   Eyes:     Pupils: Pupils are equal, round, and reactive to light.    Cardiovascular:     Rate and Rhythm: Normal rate and regular rhythm.     Pulses: Normal pulses.     Heart sounds: Normal heart sounds.  Pulmonary:     Effort: Pulmonary effort is normal.  Abdominal:     General: Abdomen is flat. Bowel sounds are normal.   Musculoskeletal:        General: Normal range of motion.     Cervical back: Normal range of motion.   Skin:    General: Skin is  warm.   Neurological:     General: No focal deficit present.     Mental Status: He is alert.   Psychiatric:        Mood and Affect: Mood normal.       EKG:   Recent Labs: 07/04/2023: BUN 15; Creatinine, Ser 0.76; Hemoglobin 15.2; Platelets 201; Potassium 3.7; Sodium 139    Lipid Panel No results found for: CHOL, TRIG, HDL, CHOLHDL, VLDL, LDLCALC, LDLDIRECT  Other studies Reviewed: Additional studies/ records that were reviewed today include:  Review of the above records demonstrates:      02/02/2017    8:58 AM  PAD Screen  Previous PAD dx? No  Previous surgical procedure? No  Pain with walking? No  Feet/toe relief with dangling? No  Painful, non-healing ulcers? No  Extremities discolored? No      ASSESSMENT AND PLAN:    ICD-10-CM   1. Other chest pain  R07.89 isosorbide mononitrate (IMDUR) 30 MG 24 hr tablet   ischaemia in lateral wall, normal LVEF. Has tightness in chest. Reluctant to have cath. Add isosrbide/asp    2. Aneurysm of ascending aorta without rupture (HCC)  I71.21 isosorbide mononitrate (IMDUR) 30 MG 24 hr tablet    3. Essential hypertension  I10 isosorbide mononitrate (IMDUR) 30 MG 24 hr tablet   [place] BP , took meds a hour ago.    4. DOE (dyspnea on exertion)  R06.09 isosorbide mononitrate (IMDUR) 30 MG 24 hr tablet    5. Dyslipidemia (high LDL; low HDL)  E78.5 isosorbide mononitrate (IMDUR) 30 MG 24 hr tablet    6. History of acute inferior wall MI  I25.2 isosorbide mononitrate (IMDUR) 30 MG 24 hr tablet       Problem List Items Addressed This Visit       Cardiovascular and Mediastinum   Thoracic aortic aneurysm without rupture (HCC) (Chronic)   Relevant Medications   isosorbide mononitrate (IMDUR) 30 MG 24 hr tablet   Essential hypertension (Chronic)   Relevant Medications   isosorbide mononitrate (IMDUR) 30 MG 24 hr tablet     Other   DOE (dyspnea on exertion) (Chronic)   Relevant Medications   isosorbide  mononitrate (IMDUR) 30 MG 24 hr tablet   Dyslipidemia (high LDL; low HDL) (Chronic)   Relevant Medications   isosorbide mononitrate (IMDUR) 30 MG 24 hr tablet   Other Visit Diagnoses       Other chest pain    -  Primary   ischaemia in lateral wall, normal LVEF. Has tightness in chest. Reluctant to have cath. Add isosrbide/asp   Relevant Medications   isosorbide mononitrate (IMDUR) 30 MG 24 hr tablet     History of acute inferior wall MI       Relevant Medications   isosorbide mononitrate (IMDUR) 30 MG 24 hr tablet          Disposition:   Return in about 4 weeks (around 12/22/2023).    Total time spent: 30 minutes  Signed,  Debborah Fairly, MD  11/24/2023 11:29 AM    [facility]

## 2023-11-30 ENCOUNTER — Ambulatory Visit
Admission: RE | Admit: 2023-11-30 | Discharge: 2023-11-30 | Disposition: A | Discharge status: Home / Self Care | Source: Ambulatory Visit | Attending: Physician Assistant | Admitting: Physician Assistant

## 2023-11-30 DIAGNOSIS — K5792 Diverticulitis of intestine, part unspecified, without perforation or abscess without bleeding: Secondary | ICD-10-CM | POA: Diagnosis not present

## 2023-11-30 DIAGNOSIS — R1031 Right lower quadrant pain: Secondary | ICD-10-CM | POA: Diagnosis not present

## 2023-11-30 DIAGNOSIS — R109 Unspecified abdominal pain: Secondary | ICD-10-CM | POA: Diagnosis not present

## 2023-11-30 DIAGNOSIS — K760 Fatty (change of) liver, not elsewhere classified: Secondary | ICD-10-CM | POA: Diagnosis not present

## 2023-11-30 DIAGNOSIS — K5730 Diverticulosis of large intestine without perforation or abscess without bleeding: Secondary | ICD-10-CM | POA: Diagnosis not present

## 2023-11-30 MED ORDER — IOHEXOL 300 MG/ML  SOLN
100.0000 mL | Freq: Once | INTRAMUSCULAR | Status: AC | PRN
  Administered 2023-11-30: 100 mL via INTRAVENOUS

## 2023-12-22 ENCOUNTER — Other Ambulatory Visit: Payer: Self-pay | Admitting: Cardiovascular Disease

## 2023-12-22 DIAGNOSIS — I10 Essential (primary) hypertension: Secondary | ICD-10-CM

## 2023-12-22 DIAGNOSIS — I7121 Aneurysm of the ascending aorta, without rupture: Secondary | ICD-10-CM

## 2023-12-22 DIAGNOSIS — E785 Hyperlipidemia, unspecified: Secondary | ICD-10-CM

## 2023-12-22 DIAGNOSIS — I252 Old myocardial infarction: Secondary | ICD-10-CM

## 2023-12-22 DIAGNOSIS — R0789 Other chest pain: Secondary | ICD-10-CM

## 2023-12-22 DIAGNOSIS — R0609 Other forms of dyspnea: Secondary | ICD-10-CM

## 2023-12-28 ENCOUNTER — Encounter: Payer: Self-pay | Admitting: Cardiovascular Disease

## 2023-12-28 ENCOUNTER — Ambulatory Visit: Admitting: Cardiovascular Disease

## 2023-12-28 ENCOUNTER — Other Ambulatory Visit: Payer: Self-pay | Admitting: Cardiovascular Disease

## 2023-12-28 VITALS — BP 122/72 | HR 64 | Ht 69.0 in | Wt 255.0 lb

## 2023-12-28 DIAGNOSIS — E785 Hyperlipidemia, unspecified: Secondary | ICD-10-CM

## 2023-12-28 DIAGNOSIS — R0789 Other chest pain: Secondary | ICD-10-CM | POA: Diagnosis not present

## 2023-12-28 DIAGNOSIS — I252 Old myocardial infarction: Secondary | ICD-10-CM

## 2023-12-28 DIAGNOSIS — R0609 Other forms of dyspnea: Secondary | ICD-10-CM

## 2023-12-28 DIAGNOSIS — I10 Essential (primary) hypertension: Secondary | ICD-10-CM | POA: Diagnosis not present

## 2023-12-28 DIAGNOSIS — I7121 Aneurysm of the ascending aorta, without rupture: Secondary | ICD-10-CM

## 2023-12-28 MED ORDER — RANOLAZINE ER 500 MG PO TB12: 500.0000 mg | ORAL_TABLET | Freq: Two times a day (BID) | ORAL | 2 refills | Status: DC

## 2023-12-28 NOTE — Progress Notes (Signed)
 Cardiology Office Note   Date:  12/28/2023   ID:  [facility], [facility] 10/07/1955, MRN [id number]  PCP:  Montey Lot, PA-C  Cardiologist:  Denyse Bathe, MD      History of Present Illness: [facility] is a 68 y.o. male who presents for  Chief Complaint  Patient presents with   Follow-up    Tightness better with medications.      Past Medical History:  Diagnosis Date   Acute appendicitis    Aortic atherosclerosis (HCC)    Arthritis    Collagen vascular disease (HCC)    COPD (chronic obstructive pulmonary disease) (HCC)    Dilated aortic root (HCC)    Dysrhythmia    right bundle branch block...(not new)   Essential hypertension 02/04/2017   Fatty liver    Headache    takes atenolol  for this   Hyperglycemia    Moderate obesity    Had been better controlled while he was living in [name] He gained 30 pounds since returning in January 2018   Myocardial infarction [facility]) 2016   lower right hand side of heart...no damage done   Right bundle branch block (RBBB) on electrocardiogram (ECG) 05/2016   Ruptured appendicitis 06/20/2017   Sleep apnea    NO CPAP.  instructed years ago to use. lost weight   Thoracic aortic aneurysm [facility])    Thoracic aortic aneurysm without rupture (HCC) 05/2016   a. noted on Cardiac Cath with TAA Angio - Aortic Root 5.1 cm. b. echo 02/2017 - dilated aortic root (46mm) and ascending aorta (43mm).     Past Surgical History:  Procedure Laterality Date   BACK SURGERY  1995   LOWER   CARDIAC CATHETERIZATION  05/2016    [facility], [name] Dr. Lamar Tamea Guan) 05/22/16: EF 55%. Normal coronary arteries. Dilated aortic root at 5.1 cm. 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 codominant   COLONOSCOPY WITH PROPOFOL  N/A 08/04/2017   Procedure: COLONOSCOPY WITH PROPOFOL ;  Surgeon: Unk Corinn Skiff, MD;  Location[facility];  Service: Gastroenterology;  Laterality: N/A;   COLONOSCOPY WITH PROPOFOL  N/A  01/11/2018   Procedure: COLONOSCOPY WITH PROPOFOL ;  Surgeon: Unk Corinn Skiff, MD;  Location: [facility] ENDOSCOPY;  Service: Gastroenterology;  Laterality: N/A;   COLONOSCOPY WITH PROPOFOL  N/A 10/08/2021   Procedure: COLONOSCOPY WITH PROPOFOL ;  Surgeon: Unk Corinn Skiff, MD;  Location: [facility] ENDOSCOPY;  Service: Gastroenterology;  Laterality: N/A;   LAPAROSCOPIC APPENDECTOMY N/A 02/19/2018   Procedure: APPENDECTOMY LAPAROSCOPIC;  Surgeon: Wonda Charlie BRAVO, MD;  Location: [facility] ORS;  Service: General;  Laterality: N/A;   ROTATOR CUFF REPAIR Left 04/2017   TOTAL HIP ARTHROPLASTY Right 11/05/2022   Procedure: TOTAL HIP ARTHROPLASTY;  Surgeon: Edna Toribio LABOR, MD;  Location: [facility];  Service: Orthopedics;  Laterality: Right;   TRANSTHORACIC ECHOCARDIOGRAM  02/2018    Normal LV size with mild LVH -No RWMA.  GR 1 DD. SABRA  EF 55 to 60%.  Mild RV dilation with mild decrease function.  Moderately dilated aortic root (estimated 48 mm with ascending aorta estimated at 42 mm)   UMBILICAL HERNIA REPAIR N/A 02/19/2018   Procedure: HERNIA REPAIR UMBILICAL ADULT;  Surgeon: Wonda Charlie BRAVO, MD;  Location: [facility] ORS;  Service: General;  Laterality: N/A;     Current Outpatient Medications  Medication Sig Dispense Refill   amLODipine (NORVASC) 5 MG tablet Take 5 mg by mouth daily.     isosorbide  mononitrate (IMDUR ) 30 MG 24 hr tablet  TAKE 1 TABLET BY MOUTH EVERY DAY 90 tablet 1   losartan  (COZAAR ) 100 MG tablet Take 1 tablet (100 mg total) by mouth daily. 30 tablet 11   Multiple Vitamins-Minerals (MENS 50+ MULTIVITAMIN) TABS Take 1 tablet by mouth daily.     Omega-3 Fatty Acids (FISH OIL) 1200 MG CAPS Take 1,200 mg by mouth daily.     omeprazole  (PRILOSEC) 40 MG capsule Take 1 capsule (40 mg total) by mouth daily for 28 days. 28 capsule 0   polyethylene glycol (MIRALAX ) 17 g packet Take 17 g by mouth daily. 14 each 0   ranolazine  (RANEXA ) 500 MG 12 hr tablet Take 1 tablet (500 mg total) by mouth 2 (two) times  daily. 60 tablet 2   zolpidem  (AMBIEN ) 10 MG tablet Take 10 mg by mouth at bedtime.  1   No current facility-administered medications for this visit.    Allergies:   Lisinopril    Social History:   reports that he has never smoked. He has never used smokeless tobacco. He reports current alcohol  use. He reports that he does not use drugs.   Family History:  family history includes Arthritis in his father and mother; Diabetes in his father and mother; Heart disease in his mother; Hypertension in his father and mother; Stroke in his brother.    ROS:     Review of Systems  Constitutional: Negative.   HENT: Negative.    Eyes: Negative.   Respiratory: Negative.    Gastrointestinal: Negative.   Genitourinary: Negative.   Musculoskeletal: Negative.   Skin: Negative.   Neurological: Negative.   Endo/Heme/Allergies: Negative.   Psychiatric/Behavioral: Negative.    All other systems reviewed and are negative.     All other systems are reviewed and negative.    PHYSICAL EXAM: VS:  BP 122/72   Pulse 64   Ht 5' 9 (1.753 m)   Wt 255 lb (115.7 kg)   SpO2 95%   BMI 37.66 kg/m  , BMI Body mass index is 37.66 kg/m. Last weight:  Wt Readings from Last 3 Encounters:  12/28/23 255 lb (115.7 kg)  11/24/23 251 lb 3.2 oz (113.9 kg)  09/14/23 249 lb (112.9 kg)     Physical Exam Vitals reviewed.  Constitutional:      Appearance: Normal appearance. He is normal weight.  HENT:     Head: Normocephalic.     Nose: Nose normal.     Mouth/Throat:     Mouth: Mucous membranes are moist.  Eyes:     Pupils: Pupils are equal, round, and reactive to light.  Cardiovascular:     Rate and Rhythm: Normal rate and regular rhythm.     Pulses: Normal pulses.     Heart sounds: Normal heart sounds.  Pulmonary:     Effort: Pulmonary effort is normal.  Abdominal:     General: Abdomen is flat. Bowel sounds are normal.  Musculoskeletal:        General: Normal range of motion.     Cervical back:  Normal range of motion.  Skin:    General: Skin is warm.  Neurological:     General: No focal deficit present.     Mental Status: He is alert.  Psychiatric:        Mood and Affect: Mood normal.       EKG:   Recent Labs: 07/04/2023: BUN 15; Creatinine, Ser 0.76; Hemoglobin 15.2; Platelets 201; Potassium 3.7; Sodium 139    Lipid Panel No results found for:  CHOL, TRIG, HDL, CHOLHDL, VLDL, LDLCALC, LDLDIRECT    Other studies Reviewed: Additional studies/ records that were reviewed today include:  Review of the above records demonstrates:      02/02/2017    8:58 AM  PAD Screen  Previous PAD dx? No  Previous surgical procedure? No  Pain with walking? No  Feet/toe relief with dangling? No  Painful, non-healing ulcers? No  Extremities discolored? No      ASSESSMENT AND PLAN:    ICD-10-CM   1. Other chest pain  R07.89 ranolazine  (RANEXA ) 500 MG 12 hr tablet   had small lateral wall ischaemia on stress test and tightness bettet with isosrbide. Add ranexa , discussed cath if more chest pains.    2. Aneurysm of ascending aorta without rupture (HCC)  I71.21 ranolazine  (RANEXA ) 500 MG 12 hr tablet    3. Essential hypertension  I10 ranolazine  (RANEXA ) 500 MG 12 hr tablet    4. DOE (dyspnea on exertion)  R06.09 ranolazine  (RANEXA ) 500 MG 12 hr tablet    5. Dyslipidemia (high LDL; low HDL)  E78.5 ranolazine  (RANEXA ) 500 MG 12 hr tablet    6. History of acute inferior wall MI  I25.2 ranolazine  (RANEXA ) 500 MG 12 hr tablet       Problem List Items Addressed This Visit       Cardiovascular and Mediastinum   Thoracic aortic aneurysm without rupture (HCC) (Chronic)   Relevant Medications   ranolazine  (RANEXA ) 500 MG 12 hr tablet   Essential hypertension (Chronic)   Relevant Medications   ranolazine  (RANEXA ) 500 MG 12 hr tablet     Other   DOE (dyspnea on exertion) (Chronic)   Relevant Medications   ranolazine  (RANEXA ) 500 MG 12 hr tablet   Dyslipidemia  (high LDL; low HDL) (Chronic)   Relevant Medications   ranolazine  (RANEXA ) 500 MG 12 hr tablet   Other Visit Diagnoses       Other chest pain    -  Primary   had small lateral wall ischaemia on stress test and tightness bettet with isosrbide. Add ranexa , discussed cath if more chest pains.   Relevant Medications   ranolazine  (RANEXA ) 500 MG 12 hr tablet     History of acute inferior wall MI       Relevant Medications   ranolazine  (RANEXA ) 500 MG 12 hr tablet          Disposition:   Return in about 4 weeks (around 01/25/2024).    Total time spent: 30 minutes  Signed,  Denyse Bathe, MD  12/28/2023 11:18 AM    [facility]

## 2024-01-06 DIAGNOSIS — I252 Old myocardial infarction: Secondary | ICD-10-CM | POA: Diagnosis not present

## 2024-01-06 DIAGNOSIS — Z9181 History of falling: Secondary | ICD-10-CM | POA: Diagnosis not present

## 2024-01-06 DIAGNOSIS — K760 Fatty (change of) liver, not elsewhere classified: Secondary | ICD-10-CM | POA: Diagnosis not present

## 2024-01-06 DIAGNOSIS — G4700 Insomnia, unspecified: Secondary | ICD-10-CM | POA: Diagnosis not present

## 2024-01-06 DIAGNOSIS — I10 Essential (primary) hypertension: Secondary | ICD-10-CM | POA: Diagnosis not present

## 2024-01-06 DIAGNOSIS — R7303 Prediabetes: Secondary | ICD-10-CM | POA: Diagnosis not present

## 2024-01-06 DIAGNOSIS — G4733 Obstructive sleep apnea (adult) (pediatric): Secondary | ICD-10-CM | POA: Diagnosis not present

## 2024-01-06 DIAGNOSIS — E785 Hyperlipidemia, unspecified: Secondary | ICD-10-CM | POA: Diagnosis not present

## 2024-01-25 DIAGNOSIS — H00029 Hordeolum internum unspecified eye, unspecified eyelid: Secondary | ICD-10-CM | POA: Diagnosis not present

## 2024-02-01 ENCOUNTER — Ambulatory Visit: Admitting: Cardiovascular Disease

## 2024-02-22 DIAGNOSIS — H0259 Other disorders affecting eyelid function: Secondary | ICD-10-CM | POA: Diagnosis not present

## 2024-02-22 DIAGNOSIS — M3501 Sicca syndrome with keratoconjunctivitis: Secondary | ICD-10-CM | POA: Diagnosis not present

## 2024-02-22 DIAGNOSIS — H2513 Age-related nuclear cataract, bilateral: Secondary | ICD-10-CM | POA: Diagnosis not present

## 2024-02-25 ENCOUNTER — Encounter: Payer: Self-pay | Admitting: Cardiovascular Disease

## 2024-02-25 ENCOUNTER — Ambulatory Visit (INDEPENDENT_AMBULATORY_CARE_PROVIDER_SITE_OTHER): Admitting: Cardiovascular Disease

## 2024-02-25 VITALS — BP 113/81 | HR 87 | Ht 69.0 in | Wt 253.4 lb

## 2024-02-25 DIAGNOSIS — I7121 Aneurysm of the ascending aorta, without rupture: Secondary | ICD-10-CM | POA: Diagnosis not present

## 2024-02-25 DIAGNOSIS — R0609 Other forms of dyspnea: Secondary | ICD-10-CM | POA: Diagnosis not present

## 2024-02-25 DIAGNOSIS — I10 Essential (primary) hypertension: Secondary | ICD-10-CM

## 2024-02-25 DIAGNOSIS — E785 Hyperlipidemia, unspecified: Secondary | ICD-10-CM

## 2024-02-25 DIAGNOSIS — R0789 Other chest pain: Secondary | ICD-10-CM | POA: Diagnosis not present

## 2024-02-25 MED ORDER — ISOSORBIDE MONONITRATE ER 30 MG PO TB24: 30.0000 mg | ORAL_TABLET | Freq: Every day | ORAL | 1 refills | Status: DC

## 2024-02-25 MED ORDER — RANOLAZINE ER 1000 MG PO TB12: 1000.0000 mg | ORAL_TABLET | Freq: Two times a day (BID) | ORAL | 2 refills | Status: DC

## 2024-02-25 NOTE — Progress Notes (Signed)
 Cardiology Office Note   Date:  02/25/2024   ID:  Richard Gallagher, DOB Nov 01, 1955, MRN [id number]  PCP:  Montey Lot, PA-C  Cardiologist:  Denyse Bathe, MD      History of Present Illness: Richard Gallagher is a 68 y.o. male who presents for  Chief Complaint  Patient presents with   Follow-up    1 month follow up    Feels very SOB.      Past Medical History:  Diagnosis Date   Acute appendicitis    Aortic atherosclerosis (HCC)    Arthritis    Collagen vascular disease (HCC)    COPD (chronic obstructive pulmonary disease) (HCC)    Dilated aortic root (HCC)    Dysrhythmia    right bundle branch block...(not new)   Essential hypertension 02/04/2017   Fatty liver    Headache    takes atenolol  for this   Hyperglycemia    Moderate obesity    Had been better controlled while he was living in [name] He gained 30 pounds since returning in January 2018   Myocardial infarction [facility]) 2016   lower right hand side of heart...no damage done   Right bundle branch block (RBBB) on electrocardiogram (ECG) 05/2016   Ruptured appendicitis 06/20/2017   Sleep apnea    NO CPAP.  instructed years ago to use. lost weight   Thoracic aortic aneurysm [facility])    Thoracic aortic aneurysm without rupture (HCC) 05/2016   a. noted on Cardiac Cath with TAA Angio - Aortic Root 5.1 cm. b. echo 02/2017 - dilated aortic root (46mm) and ascending aorta (43mm).     Past Surgical History:  Procedure Laterality Date   BACK SURGERY  1995   LOWER   CARDIAC CATHETERIZATION  05/2016    [facility] [place], [name] Dr. Lamar Tamea Guan) 05/22/16: EF 55%. Normal coronary arteries. Dilated aortic root at 5.1 cm. 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 codominant   COLONOSCOPY WITH PROPOFOL  N/A 08/04/2017   Procedure: COLONOSCOPY WITH PROPOFOL ;  Surgeon: Unk Corinn Skiff, MD;  Location: [facility] ENDOSCOPY;  Service: Gastroenterology;  Laterality: N/A;   COLONOSCOPY WITH PROPOFOL  N/A  01/11/2018   Procedure: COLONOSCOPY WITH PROPOFOL ;  Surgeon: Unk Corinn Skiff, MD;  Location: [facility] ENDOSCOPY;  Service: Gastroenterology;  Laterality: N/A;   COLONOSCOPY WITH PROPOFOL  N/A 10/08/2021   Procedure: COLONOSCOPY WITH PROPOFOL ;  Surgeon: Unk Corinn Skiff, MD;  Location: [facility] ENDOSCOPY;  Service: Gastroenterology;  Laterality: N/A;   LAPAROSCOPIC APPENDECTOMY N/A 02/19/2018   Procedure: APPENDECTOMY LAPAROSCOPIC;  Surgeon: Wonda Charlie BRAVO, MD;  Location: [facility] ORS;  Service: General;  Laterality: N/A;   ROTATOR CUFF REPAIR Left 04/2017   TOTAL HIP ARTHROPLASTY Right 11/05/2022   Procedure: TOTAL HIP ARTHROPLASTY;  Surgeon: Edna Toribio LABOR, MD;  Location: [facility] ORS;  Service: Orthopedics;  Laterality: Right;   TRANSTHORACIC ECHOCARDIOGRAM  02/2018    Normal LV size with mild LVH -No RWMA.  GR 1 DD. SABRA  EF 55 to 60%.  Mild RV dilation with mild decrease function.  Moderately dilated aortic root (estimated 48 mm with ascending aorta estimated at 42 mm)   UMBILICAL HERNIA REPAIR N/A 02/19/2018   Procedure: HERNIA REPAIR UMBILICAL ADULT;  Surgeon: Wonda Charlie BRAVO, MD;  Location: [facility] ORS;  Service: General;  Laterality: N/A;     Current Outpatient Medications  Medication Sig Dispense Refill   amLODipine (NORVASC) 5 MG tablet Take 5 mg by mouth daily.     isosorbide  mononitrate (  IMDUR ) 30 MG 24 hr tablet TAKE 1 TABLET BY MOUTH EVERY DAY 90 tablet 1   isosorbide  mononitrate (IMDUR ) 30 MG 24 hr tablet Take 1 tablet (30 mg total) by mouth daily. 30 tablet 1   losartan  (COZAAR ) 100 MG tablet Take 1 tablet (100 mg total) by mouth daily. 30 tablet 11   Multiple Vitamins-Minerals (MENS 50+ MULTIVITAMIN) TABS Take 1 tablet by mouth daily.     Omega-3 Fatty Acids (FISH OIL) 1200 MG CAPS Take 1,200 mg by mouth daily.     omeprazole  (PRILOSEC) 40 MG capsule Take 1 capsule (40 mg total) by mouth daily for 28 days. 28 capsule 0   polyethylene glycol (MIRALAX ) 17 g packet Take 17 g by mouth daily.  14 each 0   ranolazine  (RANEXA ) 1000 MG SR tablet Take 1 tablet (1,000 mg total) by mouth 2 (two) times daily. 60 tablet 2   zolpidem  (AMBIEN ) 10 MG tablet Take 10 mg by mouth at bedtime.  1   No current facility-administered medications for this visit.    Allergies:   Lisinopril    Social History:   reports that he has never smoked. He has never used smokeless tobacco. He reports current alcohol  use. He reports that he does not use drugs.   Family History:  family history includes Arthritis in his father and mother; Diabetes in his father and mother; Heart disease in his mother; Hypertension in his father and mother; Stroke in his brother.    ROS:     Review of Systems  Constitutional: Negative.   HENT: Negative.    Eyes: Negative.   Respiratory: Negative.    Gastrointestinal: Negative.   Genitourinary: Negative.   Musculoskeletal: Negative.   Skin: Negative.   Neurological: Negative.   Endo/Heme/Allergies: Negative.   Psychiatric/Behavioral: Negative.    All other systems reviewed and are negative.     All other systems are reviewed and negative.    PHYSICAL EXAM: VS:  BP 113/81   Pulse 87   Ht 5' 9 (1.753 m)   Wt 253 lb 6.4 oz (114.9 kg)   SpO2 95%   BMI 37.42 kg/m  , BMI Body mass index is 37.42 kg/m. Last weight:  Wt Readings from Last 3 Encounters:  02/25/24 253 lb 6.4 oz (114.9 kg)  12/28/23 255 lb (115.7 kg)  11/24/23 251 lb 3.2 oz (113.9 kg)     Physical Exam Vitals reviewed.  Constitutional:      Appearance: Normal appearance. He is normal weight.  HENT:     Head: Normocephalic.     Nose: Nose normal.     Mouth/Throat:     Mouth: Mucous membranes are moist.  Eyes:     Pupils: Pupils are equal, round, and reactive to light.  Cardiovascular:     Rate and Rhythm: Normal rate and regular rhythm.     Pulses: Normal pulses.     Heart sounds: Normal heart sounds.  Pulmonary:     Effort: Pulmonary effort is normal.  Abdominal:     General:  Abdomen is flat. Bowel sounds are normal.  Musculoskeletal:        General: Normal range of motion.     Cervical back: Normal range of motion.  Skin:    General: Skin is warm.  Neurological:     General: No focal deficit present.     Mental Status: He is alert.  Psychiatric:        Mood and Affect: Mood normal.  EKG:   Recent Labs: 07/04/2023: BUN 15; Creatinine, Ser 0.76; Hemoglobin 15.2; Platelets 201; Potassium 3.7; Sodium 139    Lipid Panel No results found for: CHOL, TRIG, HDL, CHOLHDL, VLDL, LDLCALC, LDLDIRECT    Other studies Reviewed: Additional studies/ records that were reviewed today include:  Review of the above records demonstrates:      02/02/2017    8:58 AM  PAD Screen  Previous PAD dx? No  Previous surgical procedure? No  Pain with walking? No  Feet/toe relief with dangling? No  Painful, non-healing ulcers? No  Extremities discolored? No      ASSESSMENT AND PLAN:    ICD-10-CM   1. Other chest pain  R07.89 ranolazine  (RANEXA ) 1000 MG SR tablet    isosorbide  mononitrate (IMDUR ) 30 MG 24 hr tablet   Resolved with 500 bid. thus will increase 100 bid, has reversible defect lateral wall, possible ischaemia in lCX. Disussed cath and want to wait.    2. Essential hypertension  I10 ranolazine  (RANEXA ) 1000 MG SR tablet    isosorbide  mononitrate (IMDUR ) 30 MG 24 hr tablet    3. Aneurysm of ascending aorta without rupture (HCC)  I71.21 ranolazine  (RANEXA ) 1000 MG SR tablet    isosorbide  mononitrate (IMDUR ) 30 MG 24 hr tablet    4. Dyslipidemia (high LDL; low HDL)  E78.5 ranolazine  (RANEXA ) 1000 MG SR tablet    isosorbide  mononitrate (IMDUR ) 30 MG 24 hr tablet    5. DOE (dyspnea on exertion)  R06.09 ranolazine  (RANEXA ) 1000 MG SR tablet    isosorbide  mononitrate (IMDUR ) 30 MG 24 hr tablet       Problem List Items Addressed This Visit       Cardiovascular and Mediastinum   Thoracic aortic aneurysm without rupture (HCC) (Chronic)    Relevant Medications   ranolazine  (RANEXA ) 1000 MG SR tablet   isosorbide  mononitrate (IMDUR ) 30 MG 24 hr tablet   Essential hypertension (Chronic)   Relevant Medications   ranolazine  (RANEXA ) 1000 MG SR tablet   isosorbide  mononitrate (IMDUR ) 30 MG 24 hr tablet     Other   DOE (dyspnea on exertion) (Chronic)   Relevant Medications   ranolazine  (RANEXA ) 1000 MG SR tablet   isosorbide  mononitrate (IMDUR ) 30 MG 24 hr tablet   Dyslipidemia (high LDL; low HDL) (Chronic)   Relevant Medications   ranolazine  (RANEXA ) 1000 MG SR tablet   isosorbide  mononitrate (IMDUR ) 30 MG 24 hr tablet   Other Visit Diagnoses       Other chest pain    -  Primary   Resolved with 500 bid. thus will increase 100 bid, has reversible defect lateral wall, possible ischaemia in lCX. Disussed cath and want to wait.   Relevant Medications   ranolazine  (RANEXA ) 1000 MG SR tablet   isosorbide  mononitrate (IMDUR ) 30 MG 24 hr tablet          Disposition:   Return in about 4 weeks (around 03/24/2024).    Total time spent: 30 minutes  Signed,  Denyse Bathe, MD  02/25/2024 10:02 AM    [facility]

## 2024-03-24 ENCOUNTER — Ambulatory Visit: Admitting: Cardiovascular Disease

## 2024-04-25 ENCOUNTER — Ambulatory Visit: Admitting: Cardiovascular Disease

## 2024-04-28 ENCOUNTER — Encounter: Payer: Self-pay | Admitting: Cardiovascular Disease

## 2024-04-28 ENCOUNTER — Ambulatory Visit: Payer: Self-pay | Admitting: Cardiovascular Disease

## 2024-04-28 ENCOUNTER — Ambulatory Visit: Admitting: Cardiovascular Disease

## 2024-04-28 VITALS — BP 118/64 | HR 90 | Ht 69.0 in | Wt 255.0 lb

## 2024-04-28 DIAGNOSIS — I10 Essential (primary) hypertension: Secondary | ICD-10-CM

## 2024-04-28 DIAGNOSIS — R9439 Abnormal result of other cardiovascular function study: Secondary | ICD-10-CM

## 2024-04-28 DIAGNOSIS — R0609 Other forms of dyspnea: Secondary | ICD-10-CM

## 2024-04-28 DIAGNOSIS — R0789 Other chest pain: Secondary | ICD-10-CM

## 2024-04-28 DIAGNOSIS — I7121 Aneurysm of the ascending aorta, without rupture: Secondary | ICD-10-CM | POA: Diagnosis not present

## 2024-04-28 DIAGNOSIS — I200 Unstable angina: Secondary | ICD-10-CM

## 2024-04-28 DIAGNOSIS — R001 Bradycardia, unspecified: Secondary | ICD-10-CM

## 2024-04-28 DIAGNOSIS — E785 Hyperlipidemia, unspecified: Secondary | ICD-10-CM

## 2024-04-28 DIAGNOSIS — I252 Old myocardial infarction: Secondary | ICD-10-CM

## 2024-04-28 LAB — CBC WITH DIFFERENTIAL/PLATELET
Basophils Absolute: 0.1 x10E3/uL (ref 0.0–0.2)
Basos: 1 %
EOS (ABSOLUTE): 0.5 x10E3/uL — ABNORMAL HIGH (ref 0.0–0.4)
Eos: 4 %
Hematocrit: 49.5 % (ref 37.5–51.0)
Hemoglobin: 16 g/dL (ref 13.0–17.7)
Immature Grans (Abs): 0 x10E3/uL (ref 0.0–0.1)
Immature Granulocytes: 0 %
Lymphocytes Absolute: 1.8 x10E3/uL (ref 0.7–3.1)
Lymphs: 17 %
MCH: 29.1 pg (ref 26.6–33.0)
MCHC: 32.3 g/dL (ref 31.5–35.7)
MCV: 90 fL (ref 79–97)
Monocytes Absolute: 1.2 x10E3/uL — ABNORMAL HIGH (ref 0.1–0.9)
Monocytes: 12 %
Neutrophils Absolute: 7.2 x10E3/uL — ABNORMAL HIGH (ref 1.4–7.0)
Neutrophils: 66 %
Platelets: 285 x10E3/uL (ref 150–450)
RBC: 5.49 x10E6/uL (ref 4.14–5.80)
RDW: 13.1 % (ref 11.6–15.4)
WBC: 10.8 x10E3/uL (ref 3.4–10.8)

## 2024-04-28 NOTE — Progress Notes (Addendum)
 Cardiology Office Note   Date:  04/28/2024   ID:  Chawn, Spraggins 05/31/1956, MRN [id number]  PCP:  Montey Lot, PA-C  Cardiologist:  Denyse Bathe, MD      History of Present Illness: Richard Gallagher is a 68 y.o. male who presents for  Chief Complaint  Patient presents with   Follow-up    Discuss cath    Still SOB, at rest and chest tightness.  Chest Pain  This is a recurrent problem. The current episode started more than 1 year ago. The pain is present in the lateral region and substernal region. The pain is at a severity of 6/10. Associated symptoms include lower extremity edema, malaise/fatigue, numbness, orthopnea and shortness of breath. Pertinent negatives include no dizziness. The treatment provided mild relief.      Past Medical History:  Diagnosis Date   Acute appendicitis    Aortic atherosclerosis    Arthritis    Collagen vascular disease    COPD (chronic obstructive pulmonary disease) (HCC)    Dilated aortic root    Dysrhythmia    right bundle branch block...(not new)   Essential hypertension 02/04/2017   Fatty liver    Headache    takes atenolol  for this   Hyperglycemia    Moderate obesity    Had been better controlled while he was living in [place]. He gained 30 pounds since returning in January 2018   Myocardial infarction [facility]) 2016   lower right hand side of heart...no damage done   Right bundle branch block (RBBB) on electrocardiogram (ECG) 05/2016   Ruptured appendicitis 06/20/2017   Sleep apnea    NO CPAP.  instructed years ago to use. lost weight   Thoracic aortic aneurysm    Thoracic aortic aneurysm without rupture 05/2016   a. noted on Cardiac Cath with TAA Angio - Aortic Root 5.1 cm. b. echo 02/2017 - dilated aortic root (46mm) and ascending aorta (43mm).     Past Surgical History:  Procedure Laterality Date   BACK SURGERY  1995   LOWER   CARDIAC CATHETERIZATION  05/2016    [facility] [place], [place]. Dr. Lamar Tamea Guan) 05/22/16: EF 55%. Normal coronary arteries. Dilated aortic root at 5.1 cm. 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 codominant   COLONOSCOPY WITH PROPOFOL  N/A 08/04/2017   Procedure: COLONOSCOPY WITH PROPOFOL ;  Surgeon: Unk Corinn Skiff, MD;  Location: [facility] ENDOSCOPY;  Service: Gastroenterology;  Laterality: N/A;   COLONOSCOPY WITH PROPOFOL  N/A 01/11/2018   Procedure: COLONOSCOPY WITH PROPOFOL ;  Surgeon: Unk Corinn Skiff, MD;  Location: [facility] ENDOSCOPY;  Service: Gastroenterology;  Laterality: N/A;   COLONOSCOPY WITH PROPOFOL  N/A 10/08/2021   Procedure: COLONOSCOPY WITH PROPOFOL ;  Surgeon: Unk Corinn Skiff, MD;  Location: [facility] ENDOSCOPY;  Service: Gastroenterology;  Laterality: N/A;   LAPAROSCOPIC APPENDECTOMY N/A 02/19/2018   Procedure: APPENDECTOMY LAPAROSCOPIC;  Surgeon: Wonda Charlie BRAVO, MD;  Location: [facility] ORS;  Service: General;  Laterality: N/A;   ROTATOR CUFF REPAIR Left 04/2017   TOTAL HIP ARTHROPLASTY Right 11/05/2022   Procedure: TOTAL HIP ARTHROPLASTY;  Surgeon: Edna Toribio LABOR, MD;  Location: [facility] ORS;  Service: Orthopedics;  Laterality: Right;   TRANSTHORACIC ECHOCARDIOGRAM  02/2018    Normal LV size with mild LVH -No RWMA.  GR 1 DD. SABRA  EF 55 to 60%.  Mild RV dilation with mild decrease function.  Moderately dilated aortic root (estimated 48 mm with ascending aorta estimated at 42 mm)   UMBILICAL HERNIA  REPAIR N/A 02/19/2018   Procedure: HERNIA REPAIR UMBILICAL ADULT;  Surgeon: Wonda Charlie BRAVO, MD;  Location: [facility] ORS;  Service: General;  Laterality: N/A;     Current Outpatient Medications  Medication Sig Dispense Refill   amLODipine (NORVASC) 5 MG tablet Take 5 mg by mouth daily.     isosorbide  mononitrate (IMDUR ) 30 MG 24 hr tablet TAKE 1 TABLET BY MOUTH EVERY DAY 90 tablet 1   isosorbide  mononitrate (IMDUR ) 30 MG 24 hr tablet Take 1 tablet (30 mg total) by mouth daily. 30 tablet 1   losartan  (COZAAR ) 100 MG tablet Take 1 tablet (100 mg total) by  mouth daily. 30 tablet 11   Multiple Vitamins-Minerals (MENS 50+ MULTIVITAMIN) TABS Take 1 tablet by mouth daily.     Omega-3 Fatty Acids (FISH OIL) 1200 MG CAPS Take 1,200 mg by mouth daily.     omeprazole  (PRILOSEC) 40 MG capsule Take 1 capsule (40 mg total) by mouth daily for 28 days. 28 capsule 0   polyethylene glycol (MIRALAX ) 17 g packet Take 17 g by mouth daily. 14 each 0   ranolazine  (RANEXA ) 1000 MG SR tablet Take 1 tablet (1,000 mg total) by mouth 2 (two) times daily. 60 tablet 2   zolpidem  (AMBIEN ) 10 MG tablet Take 10 mg by mouth at bedtime.  1   No current facility-administered medications for this visit.    Allergies:   Lisinopril    Social History:   reports that he has never smoked. He has never used smokeless tobacco. He reports current alcohol  use. He reports that he does not use drugs.   Family History:  family history includes Arthritis in his father and mother; Diabetes in his father and mother; Heart disease in his mother; Hypertension in his father and mother; Stroke in his brother.    ROS:     Review of Systems  Constitutional:  Positive for malaise/fatigue.  HENT: Negative.    Eyes: Negative.   Respiratory:  Positive for shortness of breath.   Cardiovascular:  Positive for chest pain and orthopnea.  Gastrointestinal: Negative.   Genitourinary: Negative.   Musculoskeletal: Negative.   Skin: Negative.   Neurological:  Positive for numbness. Negative for dizziness.  Endo/Heme/Allergies: Negative.   Psychiatric/Behavioral: Negative.    All other systems reviewed and are negative.     All other systems are reviewed and negative.    PHYSICAL EXAM: VS:  BP 118/64   Pulse 90   Ht 5' 9 (1.753 m)   Wt 255 lb (115.7 kg)   SpO2 94%   BMI 37.66 kg/m  , BMI Body mass index is 37.66 kg/m. Last weight:  Wt Readings from Last 3 Encounters:  04/28/24 255 lb (115.7 kg)  02/25/24 253 lb 6.4 oz (114.9 kg)  12/28/23 255 lb (115.7 kg)     Physical  Exam Vitals reviewed.  Constitutional:      Appearance: Normal appearance. He is normal weight.  HENT:     Head: Normocephalic.     Nose: Nose normal.     Mouth/Throat:     Mouth: Mucous membranes are moist.  Eyes:     Pupils: Pupils are equal, round, and reactive to light.  Cardiovascular:     Rate and Rhythm: Normal rate and regular rhythm.     Pulses: Normal pulses.     Heart sounds: Normal heart sounds.  Pulmonary:     Effort: Pulmonary effort is normal.  Abdominal:     General: Abdomen is flat. Bowel  sounds are normal.  Musculoskeletal:        General: Normal range of motion.     Cervical back: Normal range of motion.  Skin:    General: Skin is warm.  Neurological:     General: No focal deficit present.     Mental Status: He is alert.  Psychiatric:        Mood and Affect: Mood normal.       EKG:   Recent Labs: 07/04/2023: BUN 15; Creatinine, Ser 0.76; Hemoglobin 15.2; Platelets 201; Potassium 3.7; Sodium 139    Lipid Panel No results found for: CHOL, TRIG, HDL, CHOLHDL, VLDL, LDLCALC, LDLDIRECT    Other studies Reviewed: Additional studies/ records that were reviewed today include:  Review of the above records demonstrates:      02/02/2017    8:58 AM  PAD Screen  Previous PAD dx? No  Previous surgical procedure? No  Pain with walking? No  Feet/toe relief with dangling? No  Painful, non-healing ulcers? No  Extremities discolored? No      ASSESSMENT AND PLAN:    ICD-10-CM   1. Essential hypertension  I10 Comprehensive metabolic panel    CBC with Differential/Platelet    Procedural/ Surgical Case Request: LEFT HEART CATH AND CORONARY ANGIOGRAPHY with possible coronary intervention, Left Heart Cath with possible coronary intervention    2. Aneurysm of ascending aorta without rupture  I71.21 Comprehensive metabolic panel    CBC with Differential/Platelet    Procedural/ Surgical Case Request: LEFT HEART CATH AND CORONARY ANGIOGRAPHY  with possible coronary intervention, Left Heart Cath with possible coronary intervention    3. Dyslipidemia (high LDL; low HDL)  E78.5 Comprehensive metabolic panel    CBC with Differential/Platelet    Procedural/ Surgical Case Request: LEFT HEART CATH AND CORONARY ANGIOGRAPHY with possible coronary intervention, Left Heart Cath with possible coronary intervention    4. DOE (dyspnea on exertion)  R06.09 Comprehensive metabolic panel    CBC with Differential/Platelet    Procedural/ Surgical Case Request: LEFT HEART CATH AND CORONARY ANGIOGRAPHY with possible coronary intervention, Left Heart Cath with possible coronary intervention    5. Other chest pain  R07.89 Comprehensive metabolic panel    CBC with Differential/Platelet    Procedural/ Surgical Case Request: LEFT HEART CATH AND CORONARY ANGIOGRAPHY with possible coronary intervention, Left Heart Cath with possible coronary intervention   Ichaemia in LCX territory with normal EF, still has chest pain and SOb at rest. Advise cath.    6. History of acute inferior wall MI  I25.2 Comprehensive metabolic panel    CBC with Differential/Platelet    Procedural/ Surgical Case Request: LEFT HEART CATH AND CORONARY ANGIOGRAPHY with possible coronary intervention, Left Heart Cath with possible coronary intervention   lateral wall reversible defect LVEF 56% on asastress test 6/25, no improvement with medical treatment    7. Sinus bradycardia  R00.1 Comprehensive metabolic panel    CBC with Differential/Platelet    Procedural/ Surgical Case Request: LEFT HEART CATH AND CORONARY ANGIOGRAPHY with possible coronary intervention, Left Heart Cath with possible coronary intervention    8. Other chest pain  R07.89 Comprehensive metabolic panel    CBC with Differential/Platelet    Procedural/ Surgical Case Request: LEFT HEART CATH AND CORONARY ANGIOGRAPHY with possible coronary intervention, Left Heart Cath with possible coronary intervention    9. History  of acute inferior wall MI  I25.2 Comprehensive metabolic panel    CBC with Differential/Platelet    Procedural/ Surgical Case Request: LEFT HEART CATH AND  CORONARY ANGIOGRAPHY with possible coronary intervention, Left Heart Cath with possible coronary intervention       Problem List Items Addressed This Visit       Cardiovascular and Mediastinum   Thoracic aortic aneurysm without rupture (Chronic)   Relevant Orders   Comprehensive metabolic panel   CBC with Differential/Platelet   Procedural/ Surgical Case Request: LEFT HEART CATH AND CORONARY ANGIOGRAPHY with possible coronary intervention, Left Heart Cath with possible coronary intervention   Essential hypertension - Primary (Chronic)   Relevant Orders   Comprehensive metabolic panel   CBC with Differential/Platelet   Procedural/ Surgical Case Request: LEFT HEART CATH AND CORONARY ANGIOGRAPHY with possible coronary intervention, Left Heart Cath with possible coronary intervention   Sinus bradycardia   Relevant Orders   Comprehensive metabolic panel   CBC with Differential/Platelet   Procedural/ Surgical Case Request: LEFT HEART CATH AND CORONARY ANGIOGRAPHY with possible coronary intervention, Left Heart Cath with possible coronary intervention     Other   DOE (dyspnea on exertion) (Chronic)   Relevant Orders   Comprehensive metabolic panel   CBC with Differential/Platelet   Procedural/ Surgical Case Request: LEFT HEART CATH AND CORONARY ANGIOGRAPHY with possible coronary intervention, Left Heart Cath with possible coronary intervention   Dyslipidemia (high LDL; low HDL) (Chronic)   Relevant Orders   Comprehensive metabolic panel   CBC with Differential/Platelet   Procedural/ Surgical Case Request: LEFT HEART CATH AND CORONARY ANGIOGRAPHY with possible coronary intervention, Left Heart Cath with possible coronary intervention   Other chest pain   Relevant Orders   Comprehensive metabolic panel   CBC with  Differential/Platelet   Procedural/ Surgical Case Request: LEFT HEART CATH AND CORONARY ANGIOGRAPHY with possible coronary intervention, Left Heart Cath with possible coronary intervention   History of acute inferior wall MI   Relevant Orders   Comprehensive metabolic panel   CBC with Differential/Platelet   Procedural/ Surgical Case Request: LEFT HEART CATH AND CORONARY ANGIOGRAPHY with possible coronary intervention, Left Heart Cath with possible coronary intervention       Disposition:   Return in about 2 weeks (around 05/12/2024) for left heart ctah and f/u.    Total time spent: 50 minutes  Signed,  Denyse Bathe, MD  04/28/2024 2:12 PM    [facility]

## 2024-04-29 LAB — COMPREHENSIVE METABOLIC PANEL WITH GFR
ALT: 32 IU/L (ref 0–44)
AST: 19 IU/L (ref 0–40)
Albumin: 4.6 g/dL (ref 3.9–4.9)
Alkaline Phosphatase: 74 IU/L (ref 47–123)
BUN/Creatinine Ratio: 17 (ref 10–24)
BUN: 19 mg/dL (ref 8–27)
Bilirubin Total: 0.6 mg/dL (ref 0.0–1.2)
CO2: 22 mmol/L (ref 20–29)
Calcium: 9.3 mg/dL (ref 8.6–10.2)
Chloride: 100 mmol/L (ref 96–106)
Creatinine, Ser: 1.11 mg/dL (ref 0.76–1.27)
Globulin, Total: 2.3 g/dL (ref 1.5–4.5)
Glucose: 99 mg/dL (ref 70–99)
Potassium: 4.3 mmol/L (ref 3.5–5.2)
Sodium: 137 mmol/L (ref 134–144)
Total Protein: 6.9 g/dL (ref 6.0–8.5)
eGFR: 72 mL/min/1.73 (ref >59)

## 2024-05-12 ENCOUNTER — Other Ambulatory Visit: Payer: Self-pay

## 2024-05-12 ENCOUNTER — Encounter: Admission: RE | Disposition: A | Payer: Self-pay | Source: Home / Self Care | Attending: Cardiovascular Disease

## 2024-05-12 ENCOUNTER — Encounter: Payer: Self-pay | Admitting: Cardiovascular Disease

## 2024-05-12 ENCOUNTER — Ambulatory Visit
Admission: RE | Admit: 2024-05-12 | Discharge: 2024-05-12 | Disposition: A | Attending: Cardiovascular Disease | Admitting: Cardiovascular Disease

## 2024-05-12 DIAGNOSIS — I252 Old myocardial infarction: Secondary | ICD-10-CM

## 2024-05-12 DIAGNOSIS — R001 Bradycardia, unspecified: Secondary | ICD-10-CM

## 2024-05-12 DIAGNOSIS — E785 Hyperlipidemia, unspecified: Secondary | ICD-10-CM

## 2024-05-12 DIAGNOSIS — I200 Unstable angina: Secondary | ICD-10-CM

## 2024-05-12 DIAGNOSIS — I10 Essential (primary) hypertension: Secondary | ICD-10-CM

## 2024-05-12 DIAGNOSIS — I712 Thoracic aortic aneurysm, without rupture, unspecified: Secondary | ICD-10-CM | POA: Insufficient documentation

## 2024-05-12 DIAGNOSIS — I7121 Aneurysm of the ascending aorta, without rupture: Secondary | ICD-10-CM

## 2024-05-12 DIAGNOSIS — R0789 Other chest pain: Secondary | ICD-10-CM | POA: Diagnosis not present

## 2024-05-12 DIAGNOSIS — R9439 Abnormal result of other cardiovascular function study: Secondary | ICD-10-CM

## 2024-05-12 DIAGNOSIS — R0609 Other forms of dyspnea: Secondary | ICD-10-CM

## 2024-05-12 HISTORY — PX: LEFT HEART CATH AND CORONARY ANGIOGRAPHY: CATH118249

## 2024-05-12 SURGERY — LEFT HEART CATH AND CORONARY ANGIOGRAPHY
Anesthesia: Moderate Sedation | Laterality: Left

## 2024-05-12 MED ORDER — ASPIRIN 81 MG PO CHEW: 81.0000 mg | CHEWABLE_TABLET | ORAL | Status: DC

## 2024-05-12 MED ORDER — HYDRALAZINE HCL 20 MG/ML IJ SOLN: 10.0000 mg | INTRAMUSCULAR | Status: DC | PRN

## 2024-05-12 MED ORDER — HEPARIN (PORCINE) IN NACL 1000-0.9 UT/500ML-% IV SOLN
INTRAVENOUS | Status: DC | PRN
  Administered 2024-05-12: 1000 mL

## 2024-05-12 MED ORDER — FENTANYL CITRATE (PF) 100 MCG/2ML IJ SOLN
INTRAMUSCULAR | Status: AC
  Filled 2024-05-12: qty 2

## 2024-05-12 MED ORDER — ONDANSETRON HCL 4 MG/2ML IJ SOLN: 4.0000 mg | Freq: Four times a day (QID) | INTRAMUSCULAR | Status: DC | PRN

## 2024-05-12 MED ORDER — ACETAMINOPHEN 325 MG PO TABS: 650.0000 mg | ORAL_TABLET | ORAL | Status: DC | PRN

## 2024-05-12 MED ORDER — SODIUM CHLORIDE 0.9 % IV SOLN: INTRAVENOUS | Status: DC

## 2024-05-12 MED ORDER — SODIUM CHLORIDE 0.9 % IV SOLN
INTRAVENOUS | Status: AC | PRN
  Administered 2024-05-12: 10 mL/h via INTRAVENOUS
  Administered 2024-05-12: 500 mL via INTRAVENOUS

## 2024-05-12 MED ORDER — SODIUM CHLORIDE 0.9 % IV SOLN: 250.0000 mL | INTRAVENOUS | Status: DC | PRN

## 2024-05-12 MED ORDER — LIDOCAINE HCL 1 % IJ SOLN
INTRAMUSCULAR | Status: AC
  Filled 2024-05-12: qty 20

## 2024-05-12 MED ORDER — FENTANYL CITRATE (PF) 100 MCG/2ML IJ SOLN
INTRAMUSCULAR | Status: DC | PRN
  Administered 2024-05-12: 50 ug via INTRAVENOUS

## 2024-05-12 MED ORDER — HEPARIN (PORCINE) IN NACL 1000-0.9 UT/500ML-% IV SOLN
INTRAVENOUS | Status: AC
  Filled 2024-05-12: qty 1000

## 2024-05-12 MED ORDER — LABETALOL HCL 5 MG/ML IV SOLN: 10.0000 mg | INTRAVENOUS | Status: DC | PRN

## 2024-05-12 MED ORDER — IOHEXOL 300 MG/ML  SOLN
INTRAMUSCULAR | Status: DC | PRN
  Administered 2024-05-12: 155 mL

## 2024-05-12 MED ORDER — LIDOCAINE HCL (PF) 1 % IJ SOLN
INTRAMUSCULAR | Status: DC | PRN
  Administered 2024-05-12: 15 mL

## 2024-05-12 MED ORDER — MIDAZOLAM HCL (PF) 2 MG/2ML IJ SOLN
INTRAMUSCULAR | Status: DC | PRN
  Administered 2024-05-12 (×2): 1 mg via INTRAVENOUS

## 2024-05-12 MED ORDER — MIDAZOLAM HCL 2 MG/2ML IJ SOLN
INTRAMUSCULAR | Status: AC
  Filled 2024-05-12: qty 2

## 2024-05-12 MED ORDER — SODIUM CHLORIDE 0.9% FLUSH: 3.0000 mL | INTRAVENOUS | Status: DC | PRN

## 2024-05-12 MED ORDER — SODIUM CHLORIDE 0.9% FLUSH: 3.0000 mL | Freq: Two times a day (BID) | INTRAVENOUS | Status: DC

## 2024-05-12 SURGICAL SUPPLY — 12 items
CATH INFINITI 5 FR JL3.5 (CATHETERS) IMPLANT
CATH INFINITI 5 FR MPA2 (CATHETERS) IMPLANT
CATH INFINITI 5FR JL4 (CATHETERS) IMPLANT
CATH INFINITI 5FR JL5 (CATHETERS) IMPLANT
CATH INFINITI JR4 5F (CATHETERS) IMPLANT
DEVICE CLOSURE MYNXGRIP 5F (Vascular Products) IMPLANT
NDL PERC 18GX7CM (NEEDLE) IMPLANT
PACK CARDIAC CATH (CUSTOM PROCEDURE TRAY) ×2 IMPLANT
SET ATX-X65L (MISCELLANEOUS) IMPLANT
SHEATH AVANTI 5FR X 11CM (SHEATH) IMPLANT
STATION PROTECTION PRESSURIZED (MISCELLANEOUS) IMPLANT
WIRE GUIDERIGHT .035X150 (WIRE) IMPLANT

## 2024-05-16 ENCOUNTER — Ambulatory Visit: Admitting: Cardiovascular Disease

## 2024-05-16 ENCOUNTER — Encounter: Payer: Self-pay | Admitting: Cardiovascular Disease

## 2024-05-16 VITALS — BP 124/78 | HR 93 | Ht 69.0 in | Wt 253.0 lb

## 2024-05-16 DIAGNOSIS — R0789 Other chest pain: Secondary | ICD-10-CM

## 2024-05-16 DIAGNOSIS — R001 Bradycardia, unspecified: Secondary | ICD-10-CM | POA: Diagnosis not present

## 2024-05-16 DIAGNOSIS — E785 Hyperlipidemia, unspecified: Secondary | ICD-10-CM

## 2024-05-16 DIAGNOSIS — I10 Essential (primary) hypertension: Secondary | ICD-10-CM | POA: Diagnosis not present

## 2024-05-16 NOTE — Progress Notes (Signed)
 Cardiology Office Note   Date:  05/16/2024   ID:  Richard, Gallagher 12/12/1955, MRN [id number]  PCP:  Montey Lot, PA-C  Cardiologist:  Denyse Bathe, MD      History of Present Illness: Richard Gallagher is a 68 y.o. male who presents for  Chief Complaint  Patient presents with   Follow-up    L Heart Cath Follow Up    Occasional chest pain.      Past Medical History:  Diagnosis Date   Acute appendicitis    Aortic atherosclerosis    Arthritis    Collagen vascular disease    COPD (chronic obstructive pulmonary disease) (HCC)    Dilated aortic root    Dysrhythmia    right bundle branch block...(not new)   Essential hypertension 02/04/2017   Fatty liver    Headache    takes atenolol  for this   Hyperglycemia    Moderate obesity    Had been better controlled while he was living in [place]. He gained 30 pounds since returning in January 2018   Myocardial infarction [facility]) 2016   lower right hand side of heart...no damage done   Right bundle branch block (RBBB) on electrocardiogram (ECG) 05/2016   Ruptured appendicitis 06/20/2017   Sleep apnea    NO CPAP.  instructed years ago to use. lost weight   Thoracic aortic aneurysm    Thoracic aortic aneurysm without rupture 05/2016   a. noted on Cardiac Cath with TAA Angio - Aortic Root 5.1 cm. b. echo 02/2017 - dilated aortic root (46mm) and ascending aorta (43mm).     Past Surgical History:  Procedure Laterality Date   BACK SURGERY  1995   LOWER   CARDIAC CATHETERIZATION  05/2016    [facility] [place], [place]. Dr. Lamar Tamea Guan) 05/22/16: EF 55%. Normal coronary arteries. Dilated aortic root at 5.1 cm. 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 codominant   COLONOSCOPY WITH PROPOFOL  N/A 08/04/2017   Procedure: COLONOSCOPY WITH PROPOFOL ;  Surgeon: Unk Corinn Skiff, MD;  Location: [facility] ENDOSCOPY;  Service: Gastroenterology;  Laterality: N/A;   COLONOSCOPY WITH PROPOFOL  N/A 01/11/2018    Procedure: COLONOSCOPY WITH PROPOFOL ;  Surgeon: Unk Corinn Skiff, MD;  Location: [facility] ENDOSCOPY;  Service: Gastroenterology;  Laterality: N/A;   COLONOSCOPY WITH PROPOFOL  N/A 10/08/2021   Procedure: COLONOSCOPY WITH PROPOFOL ;  Surgeon: Unk Corinn Skiff, MD;  Location: [facility] ENDOSCOPY;  Service: Gastroenterology;  Laterality: N/A;   LAPAROSCOPIC APPENDECTOMY N/A 02/19/2018   Procedure: APPENDECTOMY LAPAROSCOPIC;  Surgeon: Wonda Charlie BRAVO, MD;  Location: [facility] ORS;  Service: General;  Laterality: N/A;   LEFT HEART CATH AND CORONARY ANGIOGRAPHY Left 05/12/2024   Procedure: LEFT HEART CATH AND CORONARY ANGIOGRAPHY with possible coronary intervention;  Surgeon: Bathe Denyse LABOR, MD;  Location: [facility] INVASIVE CV LAB;  Service: Cardiovascular;  Laterality: Left;   ROTATOR CUFF REPAIR Left 04/2017   TOTAL HIP ARTHROPLASTY Right 11/05/2022   Procedure: TOTAL HIP ARTHROPLASTY;  Surgeon: Edna Toribio LABOR, MD;  Location: [facility] ORS;  Service: Orthopedics;  Laterality: Right;   TRANSTHORACIC ECHOCARDIOGRAM  02/2018    Normal LV size with mild LVH -No RWMA.  GR 1 DD. SABRA  EF 55 to 60%.  Mild RV dilation with mild decrease function.  Moderately dilated aortic root (estimated 48 mm with ascending aorta estimated at 42 mm)   UMBILICAL HERNIA REPAIR N/A 02/19/2018   Procedure: HERNIA REPAIR UMBILICAL ADULT;  Surgeon: Wonda Charlie BRAVO, MD;  Location: [facility] ORS;  Service: General;  Laterality: N/A;     Current Outpatient Medications  Medication Sig Dispense Refill   amLODipine (NORVASC) 5 MG tablet Take 5 mg by mouth daily.     aspirin  EC 81 MG tablet Take 81 mg by mouth daily. Swallow whole.     carboxymethylcellulose 1 % ophthalmic solution Place 1 drop into both eyes 2 (two) times daily. Refresh     furosemide (LASIX) 40 MG tablet Take 40 mg by mouth daily.     isosorbide  mononitrate (IMDUR ) 30 MG 24 hr tablet Take 1 tablet (30 mg total) by mouth daily. 30 tablet 1   losartan  (COZAAR ) 100 MG tablet Take 1 tablet  (100 mg total) by mouth daily. 30 tablet 11   Magnesium 250 MG CAPS Take 250 mg by mouth daily.     Multiple Vitamins-Minerals (MENS 50+ MULTIVITAMIN) TABS Take 1 tablet by mouth daily.     Omega-3 Fatty Acids (FISH OIL) 1200 MG CAPS Take 1,200 mg by mouth daily.     omeprazole  (PRILOSEC) 40 MG capsule Take 1 capsule (40 mg total) by mouth daily for 28 days. (Patient not taking: Reported on 05/04/2024) 28 capsule 0   polyethylene glycol (MIRALAX ) 17 g packet Take 17 g by mouth daily. (Patient taking differently: Take 17 g by mouth daily as needed for mild constipation or moderate constipation.) 14 each 0   ranolazine  (RANEXA ) 1000 MG SR tablet Take 1 tablet (1,000 mg total) by mouth 2 (two) times daily. 60 tablet 2   tamsulosin (FLOMAX) 0.4 MG CAPS capsule Take 0.4 mg by mouth every morning.     zolpidem  (AMBIEN ) 10 MG tablet Take 10 mg by mouth at bedtime.  1   No current facility-administered medications for this visit.    Allergies:   Lisinopril    Social History:   reports that he has never smoked. He has never used smokeless tobacco. He reports current alcohol  use. He reports that he does not use drugs.   Family History:  family history includes Arthritis in his father and mother; Diabetes in his father and mother; Heart disease in his mother; Hypertension in his father and mother; Stroke in his brother.    ROS:     Review of Systems  Constitutional: Negative.   HENT: Negative.    Eyes: Negative.   Respiratory: Negative.    Gastrointestinal: Negative.   Genitourinary: Negative.   Musculoskeletal: Negative.   Skin: Negative.   Neurological: Negative.   Endo/Heme/Allergies: Negative.   Psychiatric/Behavioral: Negative.    All other systems reviewed and are negative.     All other systems are reviewed and negative.    PHYSICAL EXAM: VS:  BP 124/78   Pulse 93   Ht 5' 9 (1.753 m)   Wt 253 lb (114.8 kg)   SpO2 92%   BMI 37.36 kg/m  , BMI Body mass index is 37.36  kg/m. Last weight:  Wt Readings from Last 3 Encounters:  05/16/24 253 lb (114.8 kg)  05/12/24 252 lb 12.8 oz (114.7 kg)  04/28/24 255 lb (115.7 kg)     Physical Exam Vitals reviewed.  Constitutional:      Appearance: Normal appearance. He is normal weight.  HENT:     Head: Normocephalic.     Nose: Nose normal.     Mouth/Throat:     Mouth: Mucous membranes are moist.  Eyes:     Pupils: Pupils are equal, round, and reactive to light.  Cardiovascular:     Rate  and Rhythm: Normal rate and regular rhythm.     Pulses: Normal pulses.     Heart sounds: Normal heart sounds.  Pulmonary:     Effort: Pulmonary effort is normal.  Abdominal:     General: Abdomen is flat. Bowel sounds are normal.  Musculoskeletal:        General: Normal range of motion.     Cervical back: Normal range of motion.  Skin:    General: Skin is warm.  Neurological:     General: No focal deficit present.     Mental Status: He is alert.  Psychiatric:        Mood and Affect: Mood normal.       EKG:   Recent Labs: 04/28/2024: ALT 32; BUN 19; Creatinine, Ser 1.11; Hemoglobin 16.0; Platelets 285; Potassium 4.3; Sodium 137    Lipid Panel No results found for: CHOL, TRIG, HDL, CHOLHDL, VLDL, LDLCALC, LDLDIRECT    Other studies Reviewed: Additional studies/ records that were reviewed today include:  Review of the above records demonstrates:      02/02/2017    8:58 AM  PAD Screen  Previous PAD dx? No  Previous surgical procedure? No  Pain with walking? No  Feet/toe relief with dangling? No  Painful, non-healing ulcers? No  Extremities discolored? No      ASSESSMENT AND PLAN:    ICD-10-CM   1. Chest pain, non-cardiac  R07.89    normal coronaries and normal LVEF.    2. Dyslipidemia (high LDL; low HDL)  E78.5     3. Essential hypertension  I10     4. Sinus bradycardia  R00.1        Problem List Items Addressed This Visit       Cardiovascular and Mediastinum    Essential hypertension (Chronic)   Sinus bradycardia     Other   Dyslipidemia (high LDL; low HDL) (Chronic)   Other Visit Diagnoses       Chest pain, non-cardiac    -  Primary   normal coronaries and normal LVEF.          Disposition:   Return in about 3 months (around 08/14/2024).    Total time spent: 35 minutes  Signed,  Denyse Bathe, MD  05/16/2024 1:54 PM    [facility]

## 2024-05-28 ENCOUNTER — Other Ambulatory Visit: Payer: Self-pay | Admitting: Cardiovascular Disease

## 2024-05-28 DIAGNOSIS — R0609 Other forms of dyspnea: Secondary | ICD-10-CM

## 2024-05-28 DIAGNOSIS — I10 Essential (primary) hypertension: Secondary | ICD-10-CM

## 2024-05-28 DIAGNOSIS — R0789 Other chest pain: Secondary | ICD-10-CM

## 2024-05-28 DIAGNOSIS — E785 Hyperlipidemia, unspecified: Secondary | ICD-10-CM

## 2024-05-28 DIAGNOSIS — I7121 Aneurysm of the ascending aorta, without rupture: Secondary | ICD-10-CM

## 2024-06-19 ENCOUNTER — Other Ambulatory Visit: Payer: Self-pay | Admitting: Cardiovascular Disease

## 2024-06-19 DIAGNOSIS — R0609 Other forms of dyspnea: Secondary | ICD-10-CM

## 2024-06-19 DIAGNOSIS — I7121 Aneurysm of the ascending aorta, without rupture: Secondary | ICD-10-CM

## 2024-06-19 DIAGNOSIS — R0789 Other chest pain: Secondary | ICD-10-CM

## 2024-06-19 DIAGNOSIS — I10 Essential (primary) hypertension: Secondary | ICD-10-CM

## 2024-06-19 DIAGNOSIS — E785 Hyperlipidemia, unspecified: Secondary | ICD-10-CM

## 2024-08-16 ENCOUNTER — Ambulatory Visit: Admitting: Cardiovascular Disease
